# Patient Record
Sex: Female | Born: 1980 | Hispanic: Yes | Marital: Married | Smoking: Never smoker
Health system: Southern US, Community
[De-identification: ages and names within clinical notes are randomized; demographics above are authoritative.]

## PROBLEM LIST (undated history)

## (undated) DIAGNOSIS — R51 Headache: Secondary | ICD-10-CM

## (undated) DIAGNOSIS — K219 Gastro-esophageal reflux disease without esophagitis: Secondary | ICD-10-CM

## (undated) DIAGNOSIS — R42 Dizziness and giddiness: Secondary | ICD-10-CM

## (undated) DIAGNOSIS — R519 Headache, unspecified: Secondary | ICD-10-CM

## (undated) DIAGNOSIS — F419 Anxiety disorder, unspecified: Secondary | ICD-10-CM

---

## 2009-12-15 ENCOUNTER — Ambulatory Visit: Payer: Self-pay | Admitting: Otolaryngology

## 2017-03-18 ENCOUNTER — Encounter
Admission: RE | Admit: 2017-03-18 | Discharge: 2017-03-18 | Disposition: A | Discharge status: Home / Self Care | Payer: BC Managed Care – PPO | Source: Ambulatory Visit | Attending: Unknown Physician Specialty | Admitting: Unknown Physician Specialty

## 2017-03-18 ENCOUNTER — Encounter: Payer: Self-pay | Admitting: *Deleted

## 2017-03-18 HISTORY — DX: Headache, unspecified: R51.9

## 2017-03-18 HISTORY — DX: Anxiety disorder, unspecified: F41.9

## 2017-03-18 HISTORY — DX: Gastro-esophageal reflux disease without esophagitis: K21.9

## 2017-03-18 HISTORY — DX: Headache: R51

## 2017-03-18 NOTE — Patient Instructions (Addendum)
  Your procedure is scheduled on: 03-25-17 Report to Same Day Surgery 2nd floor medical mall [facility](Medical Mall Entrance-take elevator on left to 2nd floor.  Check in with surgery information desk.) To find out your arrival time please call [phone] between 1PM - 3PM on 03-22-17  Remember: Instructions that are not followed completely may result in serious medical risk, up to and including death, or upon the discretion of your surgeon and anesthesiologist your surgery may need to be rescheduled.    _x___ 1. Do not eat food or drink liquids after midnight. No gum chewing or hard candies.     __x__ 2. No Alcohol for 24 hours before or after surgery.   __x__3. No Smoking for 24 prior to surgery.   ____  4. Bring all medications with you on the day of surgery if instructed.    __x__ 5. Notify your doctor if there is any change in your medical condition     (cold, fever, infections).     Do not wear jewelry, make-up, hairpins, clips or nail polish.  Do not wear lotions, powders, or perfumes. You may wear deodorant.  Do not shave 48 hours prior to surgery. Men may shave face and neck.  Do not bring valuables to the hospital.    [facility] is not responsible for any belongings or valuables.               Contacts, dentures or bridgework may not be worn into surgery.  Leave your suitcase in the car. After surgery it may be brought to your room.  For patients admitted to the hospital, discharge time is determined by your                       treatment team.   Patients discharged the day of surgery will not be allowed to drive home.  You will need someone to drive you home and stay with you the night of your procedure.    Please read over the following fact sheets that you were given:     ____ Take anti-hypertensive (unless it includes a diuretic), cardiac, seizure, asthma,     anti-reflux and psychiatric medicines. These include:  1. NONE  2.  3.  4.  5.  6.  ____Fleets enema or  Magnesium Citrate as directed.   ____ Use CHG Soap or sage wipes as directed on instruction sheet   ____ Use inhalers on the day of surgery and bring to hospital day of surgery  ____ Stop Metformin and Janumet 2 days prior to surgery.    ____ Take 1/2 of usual insulin dose the night before surgery and none on the morning     surgery.   ____ Follow recommendations from Cardiologist, Pulmonologist or PCP regarding stopping Aspirin, Coumadin, Pllavix ,Eliquis, Effient, or Pradaxa, and Pletal.  X____Stop Anti-inflammatories such as Advil, Aleve, Ibuprofen, Motrin, Naproxen, Naprosyn, Goodies powders, EXCEDRIN MIGRANE or aspirin products NOW-OK to take Tylenol   ____ Stop supplements until after surgery.     ____ Bring C-Pap to the hospital.

## 2017-03-22 MED ORDER — MIDAZOLAM HCL 2 MG/ML PO SYRP: 0.3000 mg/kg | ORAL_SOLUTION | Freq: Once | ORAL | Status: DC

## 2017-03-24 MED ORDER — FAMOTIDINE 20 MG PO TABS
20.0000 mg | ORAL_TABLET | Freq: Once | ORAL | Status: AC
  Administered 2017-03-25: 20 mg via ORAL

## 2017-03-25 ENCOUNTER — Encounter
Admission: RE | Disposition: A | Discharge status: Home / Self Care | Payer: Self-pay | Source: Ambulatory Visit | Attending: Unknown Physician Specialty

## 2017-03-25 ENCOUNTER — Ambulatory Visit
Admission: RE | Admit: 2017-03-25 | Discharge: 2017-03-25 | Disposition: A | Discharge status: Home / Self Care | Payer: BC Managed Care – PPO | Source: Ambulatory Visit | Attending: Unknown Physician Specialty | Admitting: Unknown Physician Specialty

## 2017-03-25 ENCOUNTER — Ambulatory Visit: Payer: BC Managed Care – PPO | Admitting: Anesthesiology

## 2017-03-25 ENCOUNTER — Encounter: Payer: Self-pay | Admitting: *Deleted

## 2017-03-25 DIAGNOSIS — Z833 Family history of diabetes mellitus: Secondary | ICD-10-CM | POA: Insufficient documentation

## 2017-03-25 DIAGNOSIS — Z91013 Allergy to seafood: Secondary | ICD-10-CM | POA: Diagnosis not present

## 2017-03-25 DIAGNOSIS — Z8349 Family history of other endocrine, nutritional and metabolic diseases: Secondary | ICD-10-CM | POA: Diagnosis not present

## 2017-03-25 DIAGNOSIS — Z91040 Latex allergy status: Secondary | ICD-10-CM | POA: Insufficient documentation

## 2017-03-25 DIAGNOSIS — Z79899 Other long term (current) drug therapy: Secondary | ICD-10-CM | POA: Diagnosis not present

## 2017-03-25 DIAGNOSIS — K219 Gastro-esophageal reflux disease without esophagitis: Secondary | ICD-10-CM | POA: Diagnosis not present

## 2017-03-25 DIAGNOSIS — F329 Major depressive disorder, single episode, unspecified: Secondary | ICD-10-CM | POA: Diagnosis not present

## 2017-03-25 DIAGNOSIS — R2232 Localized swelling, mass and lump, left upper limb: Secondary | ICD-10-CM | POA: Diagnosis present

## 2017-03-25 DIAGNOSIS — Z888 Allergy status to other drugs, medicaments and biological substances status: Secondary | ICD-10-CM | POA: Diagnosis not present

## 2017-03-25 DIAGNOSIS — G43909 Migraine, unspecified, not intractable, without status migrainosus: Secondary | ICD-10-CM | POA: Insufficient documentation

## 2017-03-25 DIAGNOSIS — M67442 Ganglion, left hand: Secondary | ICD-10-CM | POA: Insufficient documentation

## 2017-03-25 DIAGNOSIS — F419 Anxiety disorder, unspecified: Secondary | ICD-10-CM | POA: Diagnosis not present

## 2017-03-25 DIAGNOSIS — J309 Allergic rhinitis, unspecified: Secondary | ICD-10-CM | POA: Diagnosis not present

## 2017-03-25 HISTORY — PX: GANGLION CYST EXCISION: SHX1691

## 2017-03-25 LAB — POCT PREGNANCY, URINE: Preg Test, Ur: NEGATIVE

## 2017-03-25 SURGERY — EXCISION, GANGLION CYST, WRIST
Anesthesia: General | Laterality: Left

## 2017-03-25 MED ORDER — FENTANYL CITRATE (PF) 100 MCG/2ML IJ SOLN
INTRAMUSCULAR | Status: AC
  Filled 2017-03-25: qty 2

## 2017-03-25 MED ORDER — ONDANSETRON HCL 4 MG/2ML IJ SOLN
4.0000 mg | Freq: Once | INTRAMUSCULAR | Status: AC | PRN
  Administered 2017-03-25: 4 mg via INTRAVENOUS

## 2017-03-25 MED ORDER — FENTANYL CITRATE (PF) 100 MCG/2ML IJ SOLN
INTRAMUSCULAR | Status: AC
  Administered 2017-03-25: 25 ug via INTRAVENOUS
  Filled 2017-03-25: qty 2

## 2017-03-25 MED ORDER — NORCO 5-325 MG PO TABS: 1.0000 | ORAL_TABLET | Freq: Four times a day (QID) | ORAL | 0 refills | Status: DC | PRN

## 2017-03-25 MED ORDER — FAMOTIDINE 20 MG PO TABS
ORAL_TABLET | ORAL | Status: AC
  Administered 2017-03-25: 20 mg via ORAL
  Filled 2017-03-25: qty 1

## 2017-03-25 MED ORDER — KETOROLAC TROMETHAMINE 30 MG/ML IJ SOLN
INTRAMUSCULAR | Status: DC | PRN
  Administered 2017-03-25: 30 mg via INTRAVENOUS

## 2017-03-25 MED ORDER — FENTANYL CITRATE (PF) 100 MCG/2ML IJ SOLN
INTRAMUSCULAR | Status: DC | PRN
  Administered 2017-03-25 (×4): 25 ug via INTRAVENOUS

## 2017-03-25 MED ORDER — LACTATED RINGERS IV SOLN
INTRAVENOUS | Status: DC
  Administered 2017-03-25: 07:00:00 via INTRAVENOUS

## 2017-03-25 MED ORDER — ONDANSETRON HCL 4 MG/2ML IJ SOLN
INTRAMUSCULAR | Status: AC
  Filled 2017-03-25: qty 2

## 2017-03-25 MED ORDER — BUPIVACAINE HCL (PF) 0.5 % IJ SOLN
INTRAMUSCULAR | Status: DC | PRN
  Administered 2017-03-25: 6 mL

## 2017-03-25 MED ORDER — ONDANSETRON HCL 4 MG/2ML IJ SOLN
INTRAMUSCULAR | Status: DC | PRN
Start: 2017-03-25 — End: 2017-03-25
  Administered 2017-03-25: 4 mg via INTRAVENOUS

## 2017-03-25 MED ORDER — FENTANYL CITRATE (PF) 100 MCG/2ML IJ SOLN
25.0000 ug | INTRAMUSCULAR | Status: DC | PRN
  Administered 2017-03-25 (×4): 25 ug via INTRAVENOUS

## 2017-03-25 MED ORDER — BUPIVACAINE HCL (PF) 0.5 % IJ SOLN
INTRAMUSCULAR | Status: AC
  Filled 2017-03-25: qty 30

## 2017-03-25 MED ORDER — PROPOFOL 10 MG/ML IV BOLUS
INTRAVENOUS | Status: DC | PRN
  Administered 2017-03-25: 140 mg via INTRAVENOUS

## 2017-03-25 MED ORDER — LIDOCAINE HCL (CARDIAC) 20 MG/ML IV SOLN
INTRAVENOUS | Status: DC | PRN
  Administered 2017-03-25: 50 mg via INTRAVENOUS

## 2017-03-25 MED ORDER — MIDAZOLAM HCL 2 MG/2ML IJ SOLN
INTRAMUSCULAR | Status: DC | PRN
  Administered 2017-03-25: 2 mg via INTRAVENOUS

## 2017-03-25 MED ORDER — PROPOFOL 500 MG/50ML IV EMUL
INTRAVENOUS | Status: AC
  Filled 2017-03-25: qty 50

## 2017-03-25 MED ORDER — LACTATED RINGERS IV SOLN
INTRAVENOUS | Status: DC | PRN
  Administered 2017-03-25: 07:00:00 via INTRAVENOUS

## 2017-03-25 MED ORDER — MIDAZOLAM HCL 2 MG/2ML IJ SOLN
INTRAMUSCULAR | Status: AC
  Filled 2017-03-25: qty 2

## 2017-03-25 SURGICAL SUPPLY — 25 items
BANDAGE ELASTIC 3 LF NS (GAUZE/BANDAGES/DRESSINGS) ×3 IMPLANT
BNDG ESMARK 4X12 TAN STRL LF (GAUZE/BANDAGES/DRESSINGS) ×3 IMPLANT
CANISTER SUCT 1200ML W/VALVE (MISCELLANEOUS) ×3 IMPLANT
CAST PADDING 3X4FT ST 30246 (SOFTGOODS) ×2
CHLORAPREP W/TINT 26ML (MISCELLANEOUS) ×3 IMPLANT
CUFF TOURN 18 STER (MISCELLANEOUS) ×3 IMPLANT
DURAPREP 26ML APPLICATOR (WOUND CARE) ×3 IMPLANT
ELECT REM PT RETURN 9FT ADLT (ELECTROSURGICAL) ×3
ELECTRODE REM PT RTRN 9FT ADLT (ELECTROSURGICAL) ×1 IMPLANT
GAUZE SPONGE 4X4 12PLY STRL (GAUZE/BANDAGES/DRESSINGS) ×3 IMPLANT
GLOVE BIO SURGEON STRL SZ8 (GLOVE) ×3 IMPLANT
GLOVE BIOGEL M STRL SZ7.5 (GLOVE) ×3 IMPLANT
GLOVE INDICATOR 8.0 STRL GRN (GLOVE) ×3 IMPLANT
GOWN STRL REUS W/ TWL LRG LVL3 (GOWN DISPOSABLE) ×1 IMPLANT
GOWN STRL REUS W/TWL LRG LVL3 (GOWN DISPOSABLE) ×2
GOWN STRL REUS W/TWL LRG LVL4 (GOWN DISPOSABLE) ×3 IMPLANT
KIT RM TURNOVER STRD PROC AR (KITS) ×3 IMPLANT
NS IRRIG 500ML POUR BTL (IV SOLUTION) ×3 IMPLANT
PACK EXTREMITY ARMC (MISCELLANEOUS) ×3 IMPLANT
PAD CAST CTTN 3X4 STRL (SOFTGOODS) ×1 IMPLANT
SPLINT CAST 1 STEP 3X12 (MISCELLANEOUS) ×3 IMPLANT
STOCKINETTE STRL 4IN 9604848 (GAUZE/BANDAGES/DRESSINGS) ×3 IMPLANT
SUT ETHILON 4-0 (SUTURE) ×2
SUT ETHILON 4-0 FS2 18XMFL BLK (SUTURE) ×1
SUTURE ETHLN 4-0 FS2 18XMF BLK (SUTURE) ×1 IMPLANT

## 2017-03-25 NOTE — Anesthesia Postprocedure Evaluation (Signed)
Anesthesia Post Note  Patient: Jamie Sheppard  Procedure(s) Performed: Procedure(s) (LRB): REMOVAL GANGLION OF WRIST (Left)  Patient location during evaluation: PACU Anesthesia Type: General Level of consciousness: awake and alert Pain management: pain level controlled Vital Signs Assessment: post-procedure vital signs reviewed and stable Respiratory status: spontaneous breathing, nonlabored ventilation, respiratory function stable and patient connected to nasal cannula oxygen Cardiovascular status: blood pressure returned to baseline and stable Postop Assessment: no signs of nausea or vomiting Anesthetic complications: no     Last Vitals:  Vitals:   03/25/17 0931 03/25/17 1003  BP: (!) 107/54 (!) 144/59  Pulse: 81 77  Resp: 18 18  Temp: (!) 35.9 C (!) 35.8 C    Last Pain:  Vitals:   03/25/17 1003  TempSrc: Temporal  PainSc: 3                  Odes Lolli S

## 2017-03-25 NOTE — Discharge Instructions (Signed)
Elevation  Ice pack  RTC in about 10 days   AMBULATORY SURGERY  DISCHARGE INSTRUCTIONS   1) The drugs that you were given will stay in your system until tomorrow so for the next 24 hours you should not:  A) Drive an automobile B) Make any legal decisions C) Drink any alcoholic beverage   2) You may resume regular meals tomorrow.  Today it is better to start with liquids and gradually work up to solid foods.  You may eat anything you prefer, but it is better to start with liquids, then soup and crackers, and gradually work up to solid foods.   3) Please notify your doctor immediately if you have any unusual bleeding, trouble breathing, redness and pain at the surgery site, drainage, fever, or pain not relieved by medication.    4) Additional Instructions:        Please contact your physician with any problems or Same Day Surgery at [phone], Monday through Friday 6 am to 4 pm, or [place] at [facility] number at [phone].

## 2017-03-25 NOTE — Op Note (Signed)
03/25/2017  8:48 AM  PATIENT:  Jamie Sheppard  36 y.o. female  PRE-OPERATIVE DIAGNOSIS:  Palmar soft tissue mass of left hand  POST-OPERATIVE DIAGNOSIS: Palmar soft tissue mass of left hand  PROCEDURE:  Procedure(s): REMOVAL GANGLION OF WRIST (Left) Excision palmar mass left hand  SURGEON:   Isidoro Donning., MD  ANESTHESIA: Gen.  IMPLANTS: None  HISTORY: Patient had a long history of a painful mass in the palm of her left hand.  OP NOTE: The patient was taken to the operating room where satisfactory general anesthesia was achieved. A tourniquet was applied to the patient's left upper arm. The left upper extremity was then prepped and draped in usual fashion for procedure about the hand. The left upper extremity was then exsanguinated and the tourniquet was inflated. About a inch and a half incision was made over the midportion of the mall palmar mass. The mass was located just adjacent to the thenar eminence in the mid palmar area. I bluntly and sharply dissected the mass from the surrounding soft tissue. The mass seemed to have a ganglion-type appearance and had a bluish coloration. I was able to dissect it away from the thenar musculature. There did not seem to be any nerve or vascular branch penetrating the mass. The mass was  removed in its entirety. It was sent to pathology for  evaluation. The tourniquet was released at this time. It was up for about 13 minutes. Bleeding was controlled with coagulation cautery. The wound was irrigated with saline. I then closed the skin with 4-0 nylon sutures in vertical mattress fashion. A soft compression dressing was applied.  The patient was then awakened and transferred to her hospital bed. She was taken to the recovery room in satisfactory condition. Blood loss was negligible.

## 2017-03-25 NOTE — Anesthesia Preprocedure Evaluation (Signed)
Anesthesia Evaluation  Patient identified by MRN, date of birth, ID band Patient awake    Reviewed: Allergy & Precautions, NPO status , Patient's Chart, lab work & pertinent test results, reviewed documented beta blocker date and time   Airway Mallampati: II  TM Distance: >3 FB     Dental  (+) Chipped   Pulmonary           Cardiovascular      Neuro/Psych  Headaches, Anxiety    GI/Hepatic GERD  Controlled,  Endo/Other    Renal/GU      Musculoskeletal   Abdominal   Peds  Hematology   Anesthesia Other Findings   Reproductive/Obstetrics                             Anesthesia Physical Anesthesia Plan  ASA: II  Anesthesia Plan: General   Post-op Pain Management:    Induction: Intravenous  Airway Management Planned: LMA  Additional Equipment:   Intra-op Plan:   Post-operative Plan:   Informed Consent: I have reviewed the patients History and Physical, chart, labs and discussed the procedure including the risks, benefits and alternatives for the proposed anesthesia with the patient or authorized representative who has indicated his/her understanding and acceptance.     Plan Discussed with: CRNA  Anesthesia Plan Comments:         Anesthesia Quick Evaluation

## 2017-03-25 NOTE — Anesthesia Procedure Notes (Signed)
Procedure Name: LMA Insertion Date/Time: 03/25/2017 7:32 AM Performed by: ZOXWRUE, Ebonye Reade Pre-anesthesia Checklist: Patient identified, Emergency Drugs available, Suction available, Patient being monitored and Timeout performed Patient Re-evaluated:Patient Re-evaluated prior to inductionPreoxygenation: Pre-oxygenation with 100% oxygen Intubation Type: IV induction LMA: LMA inserted LMA Size: 4.0 Number of attempts: 1 Placement Confirmation: CO2 detector,  positive ETCO2 and breath sounds checked- equal and bilateral Tube secured with: Tape

## 2017-03-25 NOTE — H&P (Signed)
  H and P reviewed. No changes. Uploaded at later date. 

## 2017-03-25 NOTE — Transfer of Care (Signed)
Immediate Anesthesia Transfer of Care Note  Patient: Jamie Sheppard  Procedure(s) Performed: Procedure(s): REMOVAL GANGLION OF WRIST (Left)  Patient Location: PACU  Anesthesia Type:General  Level of Consciousness: awake and alert   Airway & Oxygen Therapy: Patient connected to face mask oxygen  Post-op Assessment: Report given to RN  Post vital signs: Reviewed and stable  Last Vitals:  Vitals:   03/25/17 0613  BP: 113/68  Pulse: 87  Resp: 16  Temp: 36.3 C    Last Pain:  Vitals:   03/25/17 0616  TempSrc:   PainSc: 2          Complications: No apparent anesthesia complications

## 2017-03-25 NOTE — Anesthesia Post-op Follow-up Note (Cosign Needed)
Anesthesia QCDR form completed.        

## 2017-03-26 ENCOUNTER — Encounter: Payer: Self-pay | Admitting: Unknown Physician Specialty

## 2017-03-26 LAB — SURGICAL PATHOLOGY

## 2017-07-11 ENCOUNTER — Other Ambulatory Visit: Payer: Self-pay | Admitting: Family Medicine

## 2017-07-11 DIAGNOSIS — R1011 Right upper quadrant pain: Secondary | ICD-10-CM

## 2017-07-12 ENCOUNTER — Other Ambulatory Visit: Payer: Self-pay | Admitting: Obstetrics and Gynecology

## 2017-07-12 DIAGNOSIS — Z1231 Encounter for screening mammogram for malignant neoplasm of breast: Secondary | ICD-10-CM

## 2017-07-15 ENCOUNTER — Ambulatory Visit
Admission: RE | Admit: 2017-07-15 | Discharge: 2017-07-15 | Disposition: A | Discharge status: Home / Self Care | Payer: BC Managed Care – PPO | Source: Ambulatory Visit | Attending: Family Medicine | Admitting: Family Medicine

## 2017-07-15 ENCOUNTER — Other Ambulatory Visit: Payer: Self-pay | Admitting: Family Medicine

## 2017-07-15 DIAGNOSIS — R160 Hepatomegaly, not elsewhere classified: Secondary | ICD-10-CM | POA: Insufficient documentation

## 2017-07-15 DIAGNOSIS — N1339 Other hydronephrosis: Secondary | ICD-10-CM

## 2017-07-15 DIAGNOSIS — R1011 Right upper quadrant pain: Secondary | ICD-10-CM | POA: Insufficient documentation

## 2017-07-15 DIAGNOSIS — N839 Noninflammatory disorder of ovary, fallopian tube and broad ligament, unspecified: Secondary | ICD-10-CM | POA: Insufficient documentation

## 2017-07-15 DIAGNOSIS — N1330 Unspecified hydronephrosis: Secondary | ICD-10-CM | POA: Insufficient documentation

## 2017-07-23 ENCOUNTER — Other Ambulatory Visit: Payer: Self-pay | Admitting: Student

## 2017-07-23 DIAGNOSIS — R1011 Right upper quadrant pain: Secondary | ICD-10-CM

## 2017-07-23 DIAGNOSIS — R110 Nausea: Secondary | ICD-10-CM

## 2017-07-26 ENCOUNTER — Ambulatory Visit
Admission: RE | Admit: 2017-07-26 | Discharge: 2017-07-26 | Disposition: A | Discharge status: Home / Self Care | Payer: BC Managed Care – PPO | Source: Ambulatory Visit | Attending: Obstetrics and Gynecology | Admitting: Obstetrics and Gynecology

## 2017-07-26 DIAGNOSIS — R928 Other abnormal and inconclusive findings on diagnostic imaging of breast: Secondary | ICD-10-CM | POA: Diagnosis not present

## 2017-07-26 DIAGNOSIS — Z1231 Encounter for screening mammogram for malignant neoplasm of breast: Secondary | ICD-10-CM | POA: Insufficient documentation

## 2017-08-02 ENCOUNTER — Other Ambulatory Visit: Payer: Self-pay | Admitting: Obstetrics and Gynecology

## 2017-08-02 DIAGNOSIS — R928 Other abnormal and inconclusive findings on diagnostic imaging of breast: Secondary | ICD-10-CM

## 2017-08-12 ENCOUNTER — Ambulatory Visit
Admission: RE | Admit: 2017-08-12 | Discharge: 2017-08-12 | Disposition: A | Discharge status: Home / Self Care | Payer: BC Managed Care – PPO | Source: Ambulatory Visit | Attending: Student | Admitting: Student

## 2017-08-12 ENCOUNTER — Ambulatory Visit
Admission: RE | Admit: 2017-08-12 | Discharge: 2017-08-12 | Disposition: A | Discharge status: Home / Self Care | Payer: BC Managed Care – PPO | Source: Ambulatory Visit | Attending: Obstetrics and Gynecology | Admitting: Obstetrics and Gynecology

## 2017-08-12 ENCOUNTER — Other Ambulatory Visit
Admission: RE | Admit: 2017-08-12 | Discharge: 2017-08-12 | Disposition: A | Discharge status: Home / Self Care | Payer: BC Managed Care – PPO | Source: Ambulatory Visit | Attending: Student | Admitting: Student

## 2017-08-12 DIAGNOSIS — R110 Nausea: Secondary | ICD-10-CM | POA: Diagnosis present

## 2017-08-12 DIAGNOSIS — R928 Other abnormal and inconclusive findings on diagnostic imaging of breast: Secondary | ICD-10-CM

## 2017-08-12 DIAGNOSIS — N6002 Solitary cyst of left breast: Secondary | ICD-10-CM | POA: Insufficient documentation

## 2017-08-12 DIAGNOSIS — R1011 Right upper quadrant pain: Secondary | ICD-10-CM | POA: Insufficient documentation

## 2017-08-12 DIAGNOSIS — N6321 Unspecified lump in the left breast, upper outer quadrant: Secondary | ICD-10-CM | POA: Diagnosis present

## 2017-08-12 LAB — PREGNANCY, URINE: PREG TEST UR: NEGATIVE

## 2017-08-12 MED ORDER — TECHNETIUM TC 99M MEBROFENIN IV KIT
5.0700 | PACK | Freq: Once | INTRAVENOUS | Status: AC | PRN
  Administered 2017-08-12: 5.07 via INTRAVENOUS

## 2017-09-17 ENCOUNTER — Encounter
Admission: RE | Admit: 2017-09-17 | Discharge: 2017-09-17 | Disposition: A | Discharge status: Home / Self Care | Payer: BC Managed Care – PPO | Source: Ambulatory Visit | Attending: Surgery | Admitting: Surgery

## 2017-09-17 NOTE — Patient Instructions (Signed)
  Your procedure is scheduled on: 09-24-17 Report to Same Day Surgery 2nd floor medical mall [facility] Entrance-take elevator on left to 2nd floor.  Check in with surgery information desk.) To find out your arrival time please call [phone] between 1PM - 3PM on 09-23-17  Remember: Instructions that are not followed completely may result in serious medical risk, up to and including death, or upon the discretion of your surgeon and anesthesiologist your surgery may need to be rescheduled.    _x___ 1. Do not eat food after midnight the night before your procedure. You may drink clear liquids up to 2 hours before you are scheduled to arrive at the hospital for your procedure.  Do not drink clear liquids within 2 hours of your scheduled arrival to the hospital.  Clear liquids include  --Water or Apple juice without pulp  --Clear carbohydrate beverage such as ClearFast or Gatorade  --Black Coffee or Clear Tea (No milk, no creamers, do not add anything to the coffee or Tea Type 1 and type 2 diabetics should only drink water.  No gum chewing or hard candies.     __x__ 2. No Alcohol for 24 hours before or after surgery.   __x__3. No Smoking for 24 prior to surgery.   ____  4. Bring all medications with you on the day of surgery if instructed.    __x__ 5. Notify your doctor if there is any change in your medical condition     (cold, fever, infections).     Do not wear jewelry, make-up, hairpins, clips or nail polish.  Do not wear lotions, powders, or perfumes. You may wear deodorant.  Do not shave 48 hours prior to surgery. Men may shave face and neck.  Do not bring valuables to the hospital.    [facility] is not responsible for any belongings or valuables.               Contacts, dentures or bridgework may not be worn into surgery.  Leave your suitcase in the car. After surgery it may be brought to your room.  For patients admitted to the hospital, discharge time is determined by your  treatment team.   Patients discharged the day of surgery will not be allowed to drive home.  You will need someone to drive you home and stay with you the night of your procedure.    Please read over the following fact sheets that you were given:   [facility] Preparing for Surgery and or MRSA Information   _x___ Take anti-hypertensive listed below, cardiac, seizure, asthma,anti-reflux and psychiatric medicines. These include:  1. PRILOSEC  2.  3.  4.  5.  6.  ____Fleets enema or Magnesium Citrate as directed.   ____ Use CHG Soap or sage wipes as directed on instruction sheet   ____ Use inhalers on the day of surgery and bring to hospital day of surgery  ____ Stop Metformin and Janumet 2 days prior to surgery.    ____ Take 1/2 of usual insulin dose the night before surgery and none on the morning surgery.   ____ Follow recommendations from Cardiologist, Pulmonologist or PCP regarding stopping Aspirin, Coumadin, Plavix ,Eliquis, Effient, or Pradaxa, and Pletal.  X____Stop Anti-inflammatories such as Advil, Aleve, Ibuprofen, Motrin, Naproxen, MELOXICAM,  Naprosyn, Goodies powders or aspirin products NOW-OK to take Tylenol    ____ Stop supplements until after surgery.     ____ Bring C-Pap to the hospital.

## 2017-09-24 ENCOUNTER — Ambulatory Visit: Payer: BC Managed Care – PPO | Admitting: Anesthesiology

## 2017-09-24 ENCOUNTER — Encounter: Payer: Self-pay | Admitting: *Deleted

## 2017-09-24 ENCOUNTER — Encounter
Admission: RE | Disposition: A | Discharge status: Home / Self Care | Payer: Self-pay | Source: Ambulatory Visit | Attending: Surgery

## 2017-09-24 ENCOUNTER — Ambulatory Visit
Admission: RE | Admit: 2017-09-24 | Discharge: 2017-09-24 | Disposition: A | Discharge status: Home / Self Care | Payer: BC Managed Care – PPO | Source: Ambulatory Visit | Attending: Surgery | Admitting: Surgery

## 2017-09-24 ENCOUNTER — Ambulatory Visit: Payer: BC Managed Care – PPO

## 2017-09-24 DIAGNOSIS — Z79899 Other long term (current) drug therapy: Secondary | ICD-10-CM | POA: Insufficient documentation

## 2017-09-24 DIAGNOSIS — K8010 Calculus of gallbladder with chronic cholecystitis without obstruction: Secondary | ICD-10-CM | POA: Insufficient documentation

## 2017-09-24 DIAGNOSIS — K811 Chronic cholecystitis: Secondary | ICD-10-CM | POA: Diagnosis present

## 2017-09-24 DIAGNOSIS — K819 Cholecystitis, unspecified: Secondary | ICD-10-CM

## 2017-09-24 HISTORY — PX: CHOLECYSTECTOMY: SHX55

## 2017-09-24 HISTORY — DX: Dizziness and giddiness: R42

## 2017-09-24 LAB — POCT PREGNANCY, URINE: PREG TEST UR: NEGATIVE

## 2017-09-24 SURGERY — LAPAROSCOPIC CHOLECYSTECTOMY WITH INTRAOPERATIVE CHOLANGIOGRAM
Anesthesia: General

## 2017-09-24 MED ORDER — ACETAMINOPHEN 10 MG/ML IV SOLN
INTRAVENOUS | Status: AC
  Filled 2017-09-24: qty 100

## 2017-09-24 MED ORDER — SUCCINYLCHOLINE CHLORIDE 20 MG/ML IJ SOLN
INTRAMUSCULAR | Status: AC
  Filled 2017-09-24: qty 1

## 2017-09-24 MED ORDER — PROPOFOL 10 MG/ML IV BOLUS
INTRAVENOUS | Status: DC | PRN
  Administered 2017-09-24: 120 mg via INTRAVENOUS

## 2017-09-24 MED ORDER — BUPIVACAINE-EPINEPHRINE (PF) 0.5% -1:200000 IJ SOLN
INTRAMUSCULAR | Status: AC
  Filled 2017-09-24: qty 30

## 2017-09-24 MED ORDER — ACETAMINOPHEN 10 MG/ML IV SOLN
INTRAVENOUS | Status: DC | PRN
  Administered 2017-09-24: 1000 mg via INTRAVENOUS

## 2017-09-24 MED ORDER — OXYCODONE HCL 5 MG/5ML PO SOLN: 5.0000 mg | Freq: Once | ORAL | Status: DC | PRN

## 2017-09-24 MED ORDER — TRAMADOL HCL 50 MG PO TABS: 50.0000 mg | ORAL_TABLET | Freq: Four times a day (QID) | ORAL | 0 refills | Status: DC | PRN

## 2017-09-24 MED ORDER — FENTANYL CITRATE (PF) 100 MCG/2ML IJ SOLN
INTRAMUSCULAR | Status: AC
  Filled 2017-09-24: qty 2

## 2017-09-24 MED ORDER — ROCURONIUM BROMIDE 50 MG/5ML IV SOLN
INTRAVENOUS | Status: AC
  Filled 2017-09-24: qty 1

## 2017-09-24 MED ORDER — FENTANYL CITRATE (PF) 100 MCG/2ML IJ SOLN
INTRAMUSCULAR | Status: AC
  Administered 2017-09-24: 25 ug via INTRAVENOUS
  Filled 2017-09-24: qty 2

## 2017-09-24 MED ORDER — BUPIVACAINE-EPINEPHRINE 0.5% -1:200000 IJ SOLN
INTRAMUSCULAR | Status: DC | PRN
  Administered 2017-09-24: 10 mL

## 2017-09-24 MED ORDER — TRAMADOL HCL 50 MG PO TABS: 50.0000 mg | ORAL_TABLET | Freq: Four times a day (QID) | ORAL | 0 refills | Status: AC | PRN

## 2017-09-24 MED ORDER — SODIUM CHLORIDE 0.9 % IJ SOLN
INTRAMUSCULAR | Status: DC
Start: 2017-09-24 — End: 2017-09-24
  Filled 2017-09-24: qty 10

## 2017-09-24 MED ORDER — DEXAMETHASONE SODIUM PHOSPHATE 10 MG/ML IJ SOLN
INTRAMUSCULAR | Status: DC | PRN
  Administered 2017-09-24: 10 mg via INTRAVENOUS

## 2017-09-24 MED ORDER — MIDAZOLAM HCL 2 MG/2ML IJ SOLN
INTRAMUSCULAR | Status: AC
  Filled 2017-09-24: qty 2

## 2017-09-24 MED ORDER — FENTANYL CITRATE (PF) 100 MCG/2ML IJ SOLN
25.0000 ug | INTRAMUSCULAR | Status: DC | PRN
  Administered 2017-09-24: 25 ug via INTRAVENOUS

## 2017-09-24 MED ORDER — LACTATED RINGERS IV SOLN
INTRAVENOUS | Status: DC
  Administered 2017-09-24: 50 mL/h via INTRAVENOUS

## 2017-09-24 MED ORDER — ONDANSETRON HCL 4 MG/2ML IJ SOLN
INTRAMUSCULAR | Status: AC
  Filled 2017-09-24: qty 2

## 2017-09-24 MED ORDER — EPHEDRINE SULFATE 50 MG/ML IJ SOLN
INTRAMUSCULAR | Status: AC
  Filled 2017-09-24: qty 1

## 2017-09-24 MED ORDER — DEXAMETHASONE SODIUM PHOSPHATE 10 MG/ML IJ SOLN
INTRAMUSCULAR | Status: AC
  Filled 2017-09-24: qty 1

## 2017-09-24 MED ORDER — PROPOFOL 10 MG/ML IV BOLUS
INTRAVENOUS | Status: AC
  Filled 2017-09-24: qty 40

## 2017-09-24 MED ORDER — LIDOCAINE HCL (CARDIAC) 20 MG/ML IV SOLN
INTRAVENOUS | Status: DC | PRN
  Administered 2017-09-24: 80 mg via INTRAVENOUS

## 2017-09-24 MED ORDER — PHENYLEPHRINE HCL 10 MG/ML IJ SOLN
INTRAMUSCULAR | Status: AC
  Filled 2017-09-24: qty 1

## 2017-09-24 MED ORDER — PROMETHAZINE HCL 25 MG/ML IJ SOLN
6.2500 mg | INTRAMUSCULAR | Status: DC | PRN
  Administered 2017-09-24: 6.25 mg via INTRAVENOUS

## 2017-09-24 MED ORDER — OXYCODONE HCL 5 MG PO TABS: 5.0000 mg | ORAL_TABLET | Freq: Once | ORAL | Status: DC | PRN

## 2017-09-24 MED ORDER — FENTANYL CITRATE (PF) 100 MCG/2ML IJ SOLN
INTRAMUSCULAR | Status: DC | PRN
  Administered 2017-09-24: 100 ug via INTRAVENOUS
  Administered 2017-09-24: 50 ug via INTRAVENOUS

## 2017-09-24 MED ORDER — LIDOCAINE HCL (PF) 2 % IJ SOLN
INTRAMUSCULAR | Status: AC
  Filled 2017-09-24: qty 6

## 2017-09-24 MED ORDER — MIDAZOLAM HCL 2 MG/2ML IJ SOLN
INTRAMUSCULAR | Status: DC | PRN
  Administered 2017-09-24: 2 mg via INTRAVENOUS

## 2017-09-24 MED ORDER — SUGAMMADEX SODIUM 200 MG/2ML IV SOLN
INTRAVENOUS | Status: AC
  Filled 2017-09-24: qty 2

## 2017-09-24 MED ORDER — SCOPOLAMINE 1 MG/3DAYS TD PT72
MEDICATED_PATCH | TRANSDERMAL | Status: AC
  Filled 2017-09-24: qty 1

## 2017-09-24 MED ORDER — HEPARIN SODIUM (PORCINE) 5000 UNIT/ML IJ SOLN
INTRAMUSCULAR | Status: AC
  Filled 2017-09-24: qty 1

## 2017-09-24 MED ORDER — LIDOCAINE HCL 2 % EX GEL
CUTANEOUS | Status: DC | PRN
  Administered 2017-09-24: 1 via TOPICAL

## 2017-09-24 MED ORDER — GLYCOPYRROLATE 0.2 MG/ML IJ SOLN
INTRAMUSCULAR | Status: AC
  Filled 2017-09-24: qty 1

## 2017-09-24 MED ORDER — ONDANSETRON HCL 4 MG/2ML IJ SOLN
INTRAMUSCULAR | Status: DC | PRN
  Administered 2017-09-24: 4 mg via INTRAVENOUS

## 2017-09-24 MED ORDER — PROMETHAZINE HCL 25 MG/ML IJ SOLN
INTRAMUSCULAR | Status: AC
  Administered 2017-09-24: 6.25 mg via INTRAVENOUS
  Filled 2017-09-24: qty 1

## 2017-09-24 MED ORDER — TRAMADOL HCL 50 MG PO TABS: 50.0000 mg | ORAL_TABLET | ORAL | Status: DC | PRN

## 2017-09-24 MED ORDER — LIDOCAINE HCL 2 % EX GEL
CUTANEOUS | Status: AC
  Filled 2017-09-24: qty 5

## 2017-09-24 MED ORDER — ROCURONIUM BROMIDE 100 MG/10ML IV SOLN
INTRAVENOUS | Status: DC | PRN
  Administered 2017-09-24: 40 mg via INTRAVENOUS

## 2017-09-24 MED ORDER — SUGAMMADEX SODIUM 200 MG/2ML IV SOLN
INTRAVENOUS | Status: DC | PRN
  Administered 2017-09-24: 200 mg via INTRAVENOUS

## 2017-09-24 MED ORDER — SCOPOLAMINE 1 MG/3DAYS TD PT72
1.0000 | MEDICATED_PATCH | Freq: Once | TRANSDERMAL | Status: DC
  Administered 2017-09-24: 1.5 mg via TRANSDERMAL

## 2017-09-24 MED ORDER — MEPERIDINE HCL 50 MG/ML IJ SOLN: 6.2500 mg | INTRAMUSCULAR | Status: DC | PRN

## 2017-09-24 SURGICAL SUPPLY — 37 items
APPLIER CLIP ROT 10 11.4 M/L (STAPLE) ×3
CANISTER SUCT 1200ML W/VALVE (MISCELLANEOUS) ×3 IMPLANT
CANNULA DILATOR 10 W/SLV (CANNULA) ×2 IMPLANT
CANNULA DILATOR 10MM W/SLV (CANNULA) ×1
CATH REDDICK CHOLANGI 4FR 50CM (CATHETERS) ×3 IMPLANT
CHLORAPREP W/TINT 26ML (MISCELLANEOUS) ×3 IMPLANT
CLIP APPLIE ROT 10 11.4 M/L (STAPLE) ×1 IMPLANT
CLOSURE WOUND 1/2 X4 (GAUZE/BANDAGES/DRESSINGS) ×1
DRAPE SHEET LG 3/4 BI-LAMINATE (DRAPES) ×3 IMPLANT
ELECT REM PT RETURN 9FT ADLT (ELECTROSURGICAL) ×3
ELECTRODE REM PT RTRN 9FT ADLT (ELECTROSURGICAL) ×1 IMPLANT
GAUZE SPONGE 4X4 12PLY STRL (GAUZE/BANDAGES/DRESSINGS) ×3 IMPLANT
GLOVE BIO SURGEON STRL SZ7.5 (GLOVE) ×12 IMPLANT
GOWN STRL REUS W/ TWL LRG LVL3 (GOWN DISPOSABLE) ×3 IMPLANT
GOWN STRL REUS W/TWL LRG LVL3 (GOWN DISPOSABLE) ×6
IRRIGATION STRYKERFLOW (MISCELLANEOUS) ×1 IMPLANT
IRRIGATOR STRYKERFLOW (MISCELLANEOUS) ×3
IV NS 1000ML (IV SOLUTION) ×2
IV NS 1000ML BAXH (IV SOLUTION) ×1 IMPLANT
KIT RM TURNOVER STRD PROC AR (KITS) ×3 IMPLANT
LABEL OR SOLS (LABEL) ×3 IMPLANT
NDL INSUFF ACCESS 14 VERSASTEP (NEEDLE) ×3 IMPLANT
NEEDLE FILTER BLUNT 18X 1/2SAF (NEEDLE) ×2
NEEDLE FILTER BLUNT 18X1 1/2 (NEEDLE) ×1 IMPLANT
NS IRRIG 500ML POUR BTL (IV SOLUTION) ×3 IMPLANT
PACK LAP CHOLECYSTECTOMY (MISCELLANEOUS) ×3 IMPLANT
SCISSORS METZENBAUM CVD 33 (INSTRUMENTS) ×3 IMPLANT
SEAL FOR SCOPE WARMER C3101 (MISCELLANEOUS) IMPLANT
SLEEVE ENDOPATH XCEL 5M (ENDOMECHANICALS) ×3 IMPLANT
STRIP CLOSURE SKIN 1/2X4 (GAUZE/BANDAGES/DRESSINGS) ×2 IMPLANT
SUT CHROMIC 5 0 RB 1 27 (SUTURE) ×3 IMPLANT
SUT VIC AB 0 CT2 27 (SUTURE) IMPLANT
SYR 3ML LL SCALE MARK (SYRINGE) ×3 IMPLANT
TROCAR XCEL NON-BLD 11X100MML (ENDOMECHANICALS) ×3 IMPLANT
TROCAR XCEL NON-BLD 5MMX100MML (ENDOMECHANICALS) ×3 IMPLANT
TUBING INSUFFLATOR HI FLOW (MISCELLANEOUS) ×3 IMPLANT
WATER STERILE IRR 1000ML POUR (IV SOLUTION) ×3 IMPLANT

## 2017-09-24 NOTE — Anesthesia Procedure Notes (Signed)
Procedure Name: Intubation Date/Time: 09/24/2017 7:36 AM Performed by: Doreen Salvage Pre-anesthesia Checklist: Patient identified, Patient being monitored, Timeout performed, Emergency Drugs available and Suction available Patient Re-evaluated:Patient Re-evaluated prior to induction Oxygen Delivery Method: Circle system utilized Preoxygenation: Pre-oxygenation with 100% oxygen Induction Type: IV induction Ventilation: Mask ventilation without difficulty Laryngoscope Size: Mac and 3 Grade View: Grade I Tube type: Oral Tube size: 7.0 mm Number of attempts: 1 Airway Equipment and Method: Stylet Placement Confirmation: ETT inserted through vocal cords under direct vision,  positive ETCO2 and breath sounds checked- equal and bilateral Secured at: 21 cm Tube secured with: Tape Dental Injury: Teeth and Oropharynx as per pre-operative assessment

## 2017-09-24 NOTE — Transfer of Care (Signed)
Immediate Anesthesia Transfer of Care Note  Patient: Jamie Sheppard  Procedure(s) Performed: Procedure(s): LAPAROSCOPIC CHOLECYSTECTOMY (N/A)  Patient Location: PACU  Anesthesia Type:General  Level of Consciousness: sedated  Airway & Oxygen Therapy: Patient Spontanous Breathing and Patient connected to face mask oxygen  Post-op Assessment: Report given to RN and Post -op Vital signs reviewed and stable  Post vital signs: Reviewed and stable  Last Vitals:  Vitals:   09/24/17 0622 09/24/17 0846  BP: 114/63 101/65  Pulse: 81 71  Resp: 14 16  Temp: 36.8 C 36.5 C  SpO2: 100% 98%    Complications: No apparent anesthesia complications

## 2017-09-24 NOTE — Anesthesia Postprocedure Evaluation (Signed)
Anesthesia Post Note  Patient: Jamie Sheppard  Procedure(s) Performed: LAPAROSCOPIC CHOLECYSTECTOMY (N/A )  Patient location during evaluation: PACU Anesthesia Type: General Level of consciousness: awake and alert and oriented Pain management: pain level controlled Vital Signs Assessment: post-procedure vital signs reviewed and stable Respiratory status: spontaneous breathing, nonlabored ventilation and respiratory function stable Cardiovascular status: blood pressure returned to baseline and stable Postop Assessment: no signs of nausea or vomiting Anesthetic complications: no     Last Vitals:  Vitals:   09/24/17 0901 09/24/17 0916  BP: 108/69 (!) 96/58  Pulse: 87 77  Resp: (!) 22 (!) 24  Temp:    SpO2: 96% 91%    Last Pain:  Vitals:   09/24/17 0916  TempSrc:   PainSc: 0-No pain                 Rion Catala

## 2017-09-24 NOTE — Op Note (Signed)
OPERATIVE REPORT  PREOPERATIVE DIAGNOSIS:  Chronic cholecystitis   POSTOPERATIVE DIAGNOSIS: Chronic cholecystitis cholelithiasis  PROCEDURE: Laparoscopic cholecystectomy   ANESTHESIA: General  SURGEON: Renda Rolls M.D.  INDICATIONS: She has a history of right upper quadrant pains. Ultrasound demonstrated no gallstones. Hepatobiliary scan was with a low gallbladder ejection fraction of 29%. Surgery was recommended for definitive treatment.    With the patient on the operating table in the supine position under general endotracheal anesthesia the abdomen was prepared with ChloraPrep solution and draped in a sterile manner. A short incision was made in the inferior aspect of the umbilicus and carried down to the deep fascia which was grasped with a laryngeal hook. A Veress needle was inserted aspirated and irrigated with a saline solution. The peritoneal cavity was insufflated with carbon dioxide. The Veress needle was removed. The 10 mm cannula was inserted. The 10 mm 0 laparoscope was inserted to view the peritoneal cavity.  Another incision was made in the epigastrium slightly to the right of the midline to introduce an 11 mm cannula. 2 incisions were made in the lateral aspect of the right upper quadrant to introduce 2   5 mm cannulas. Initial inspection revealed a smooth surface of the liver.  The gallbladder was retracted towards the right shoulder. There was atypical appearance of cholesterolosis The gallbladder neck was retracted inferiorly and laterally.  The porta hepatis was identified. The gallbladder was mobilized with incision of the visceral peritoneum. The cystic duct was dissected free from surrounding structures. The cystic artery was dissected free from surrounding structures. A critical view of safety was demonstrated  An Endo Clip was placed across the cystic duct adjacent to the gallbladder neck. An incision was made in the cystic duct to introduce a Reddick catheter. The  cystic duct was very small and the catheter would not thread into the cystic duct therefore colonogram was not done. The Reddick catheter was removed. The cystic duct was doubly ligated with endoclips and divided. The cystic artery was controlled with double endoclips and divided. The gallbladder was dissected free from the liver with use of hook and cautery and blunt dissection. Bleeding was minimal and hemostasis was intact. The gallbladder was delivered up through the infraumbilical incision opened and suctioned. The gallbladder was removed and did contain a small palpable stone and was submitted in formalin for routine pathology. Blood loss for the operation was less than 1 cc. The cannulas were removed allowing carbon dioxide to escape from the peritoneal cavity The skin incisions were closed with interrupted 5-0 chromic subcutaneous suture benzoin and Steri-Strips. Gauze dressings were applied with paper tape.  The patient appeared to be in satisfactory condition and was prepared for transfer to the recovery room  Renda Rolls M.D.

## 2017-09-24 NOTE — Discharge Instructions (Addendum)
AMBULATORY SURGERY  DISCHARGE INSTRUCTIONS   1) The drugs that you were given will stay in your system until tomorrow so for the next 24 hours you should not:  A) Drive an automobile B) Make any legal decisions C) Drink any alcoholic beverage   2) You may resume regular meals tomorrow.  Today it is better to start with liquids and gradually work up to solid foods.  You may eat anything you prefer, but it is better to start with liquids, then soup and crackers, and gradually work up to solid foods.   3) Please notify your doctor immediately if you have any unusual bleeding, trouble breathing, redness and pain at the surgery site, drainage, fever, or pain not relieved by medication.    4) Additional Instructions:        Please contact your physician with any problems or Same Day Surgery at [phone], Monday through Friday 6 am to 4 pm, or [place] at [facility] number at [phone].Take Tylenol and/or tramadol as needed for pain.  Should not drive or do anything dangerous when taking tramadol.  Remove dressings on Wednesday. May shower Thursday.

## 2017-09-24 NOTE — H&P (Signed)
  She comes today prepared for laparoscopic cholecystectomy.  She reports no change in condition since office exam.  Labs noted  Discussed plan for surgery.

## 2017-09-24 NOTE — OR Nursing (Signed)
Nausea resolved per patient, desires discharge

## 2017-09-24 NOTE — Anesthesia Preprocedure Evaluation (Signed)
Anesthesia Evaluation  Patient identified by MRN, date of birth, ID band Patient awake    Reviewed: Allergy & Precautions, NPO status , Patient's Chart, lab work & pertinent test results  History of Anesthesia Complications Negative for: history of anesthetic complications  Airway Mallampati: III  TM Distance: >3 FB Neck ROM: Full    Dental no notable dental hx.    Pulmonary neg pulmonary ROS, neg sleep apnea, neg COPD,    breath sounds clear to auscultation- rhonchi (-) wheezing      Cardiovascular Exercise Tolerance: Good (-) hypertension(-) CAD, (-) Past MI and (-) Cardiac Stents  Rhythm:Regular Rate:Normal - Systolic murmurs and - Diastolic murmurs    Neuro/Psych  Headaches, Anxiety    GI/Hepatic Neg liver ROS, GERD  ,  Endo/Other  negative endocrine ROSneg diabetes  Renal/GU negative Renal ROS     Musculoskeletal negative musculoskeletal ROS (+)   Abdominal (+) + obese,   Peds  Hematology negative hematology ROS (+)   Anesthesia Other Findings Past Medical History: No date: Anxiety No date: Dizziness No date: GERD (gastroesophageal reflux disease)     Comment:  occ-no meds No date: Headache     Comment:  MIGRAINES-HORMONE RELATED   Reproductive/Obstetrics                             Anesthesia Physical Anesthesia Plan  ASA: II  Anesthesia Plan: General   Post-op Pain Management:    Induction: Intravenous  PONV Risk Score and Plan: 2 and Ondansetron, Dexamethasone and Scopolamine patch - Pre-op  Airway Management Planned: Oral ETT  Additional Equipment:   Intra-op Plan:   Post-operative Plan: Extubation in OR  Informed Consent: I have reviewed the patients History and Physical, chart, labs and discussed the procedure including the risks, benefits and alternatives for the proposed anesthesia with the patient or authorized representative who has indicated his/her  understanding and acceptance.   Dental advisory given  Plan Discussed with: CRNA and Anesthesiologist  Anesthesia Plan Comments:         Anesthesia Quick Evaluation

## 2017-09-24 NOTE — Anesthesia Post-op Follow-up Note (Signed)
Anesthesia QCDR form completed.        

## 2017-09-25 LAB — SURGICAL PATHOLOGY

## 2017-12-09 ENCOUNTER — Other Ambulatory Visit: Payer: Self-pay | Admitting: Certified Nurse Midwife

## 2017-12-09 DIAGNOSIS — N6320 Unspecified lump in the left breast, unspecified quadrant: Secondary | ICD-10-CM

## 2017-12-18 ENCOUNTER — Ambulatory Visit
Admission: RE | Admit: 2017-12-18 | Discharge: 2017-12-18 | Disposition: A | Discharge status: Home / Self Care | Payer: BC Managed Care – PPO | Source: Ambulatory Visit | Attending: Certified Nurse Midwife | Admitting: Certified Nurse Midwife

## 2017-12-18 DIAGNOSIS — N6002 Solitary cyst of left breast: Secondary | ICD-10-CM | POA: Diagnosis not present

## 2017-12-18 DIAGNOSIS — N6320 Unspecified lump in the left breast, unspecified quadrant: Secondary | ICD-10-CM

## 2017-12-23 ENCOUNTER — Telehealth: Payer: Self-pay | Admitting: Obstetrics and Gynecology

## 2017-12-23 NOTE — Telephone Encounter (Signed)
Chart reviewed.

## 2018-08-16 IMAGING — MG MM DIGITAL DIAGNOSTIC UNILAT*L* W/ TOMO W/ CAD
6 of 9 series · 6 of 21 positions shown · non-contrast
Comparison: Previous exam(s).

CLINICAL DATA: Patient presents for a diagnostic left breast
examination due to a palpable abnormality over the upper central
breast which she states feels different and harder/bigger. Patient
had a recent diagnostic left breast examination 08/12/2017
demonstrating a large simple cyst in this area.

EXAM:
2D DIGITAL DIAGNOSTIC left MAMMOGRAM WITH CAD AND ADJUNCT TOMO
ULTRASOUND left BREAST

[L MLO synth-2D (1 of 2)]
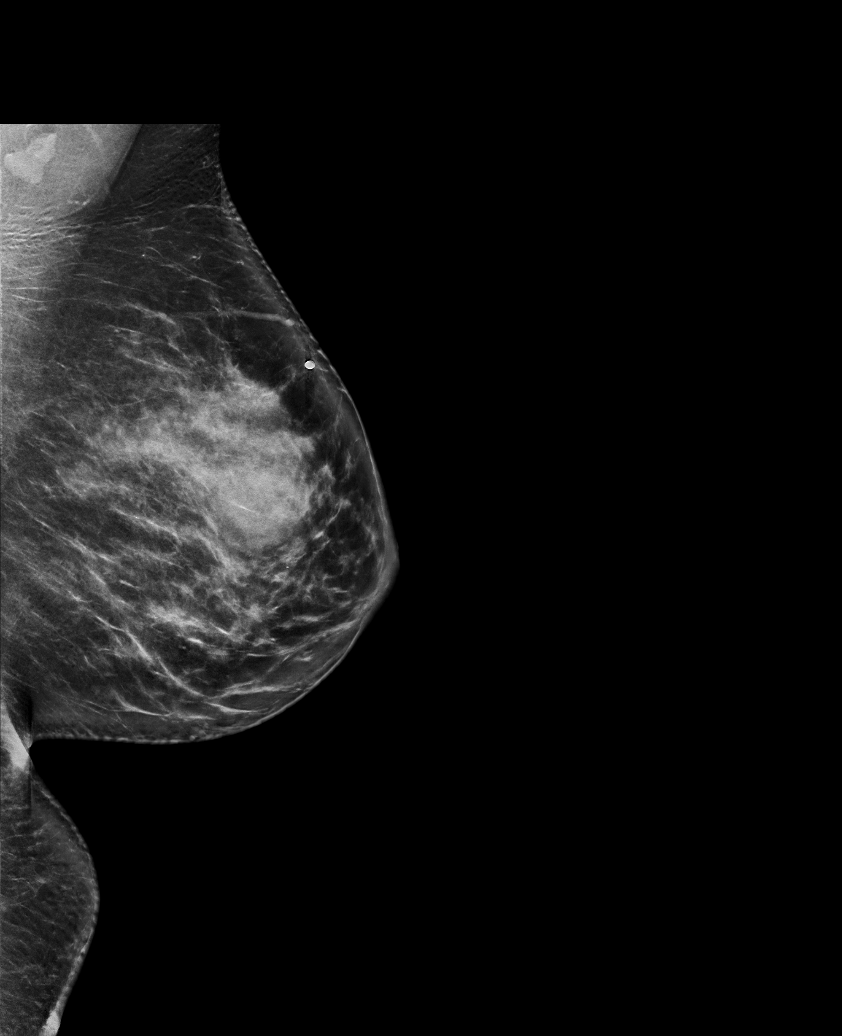

[L MLO synth-2D (2 of 2)]
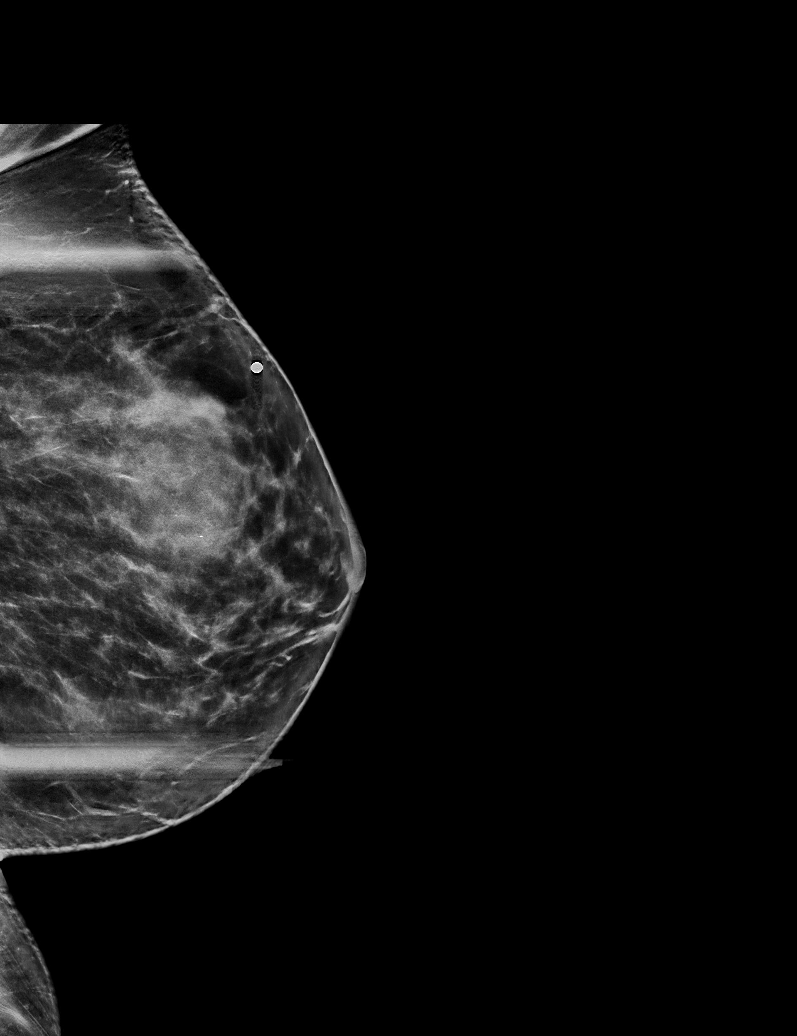

[L CC]
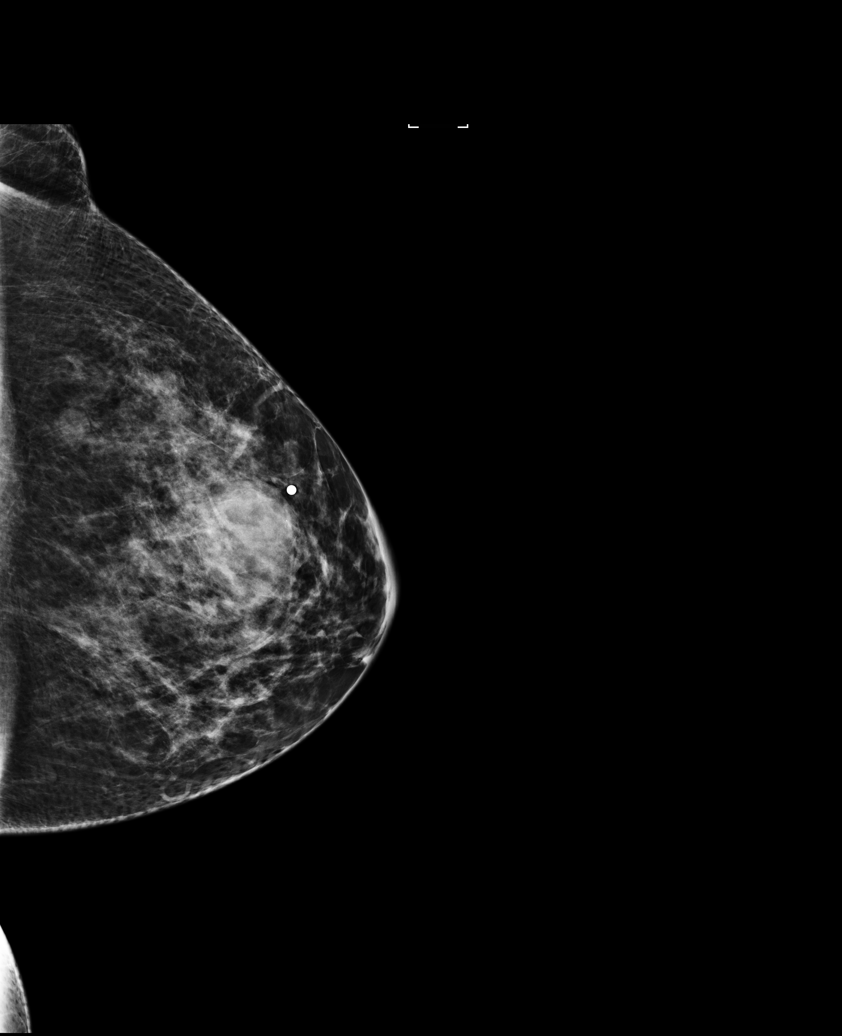

[L MLO (1 of 2)]
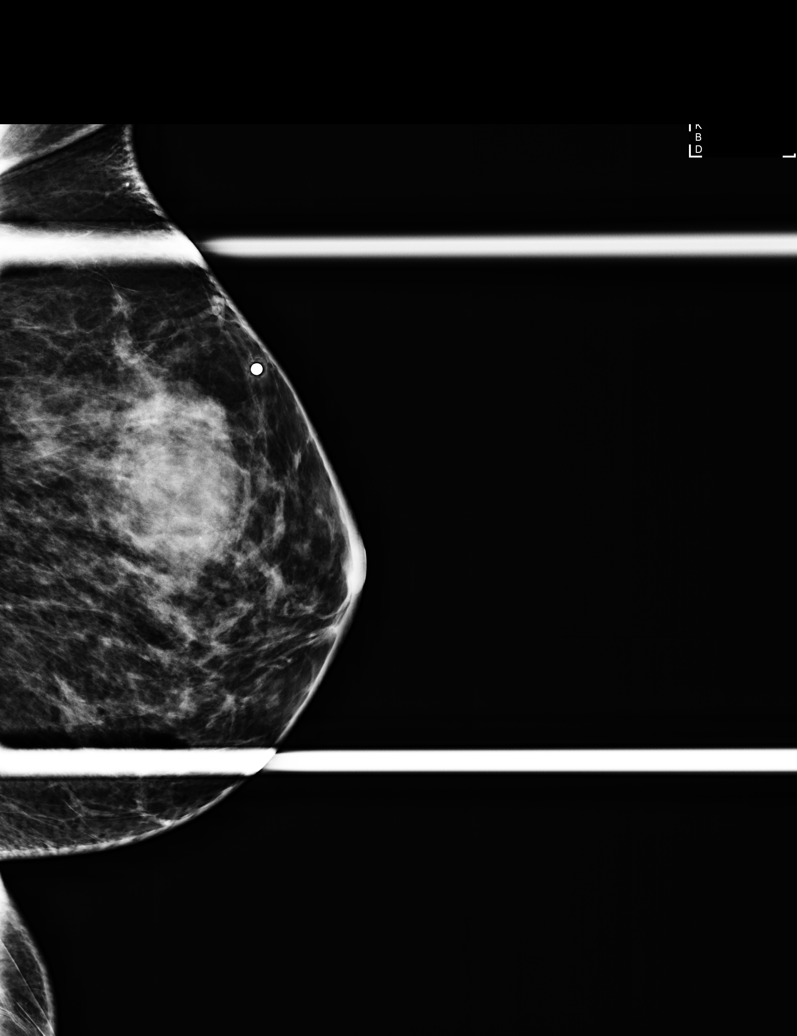

[L CC synth-2D]
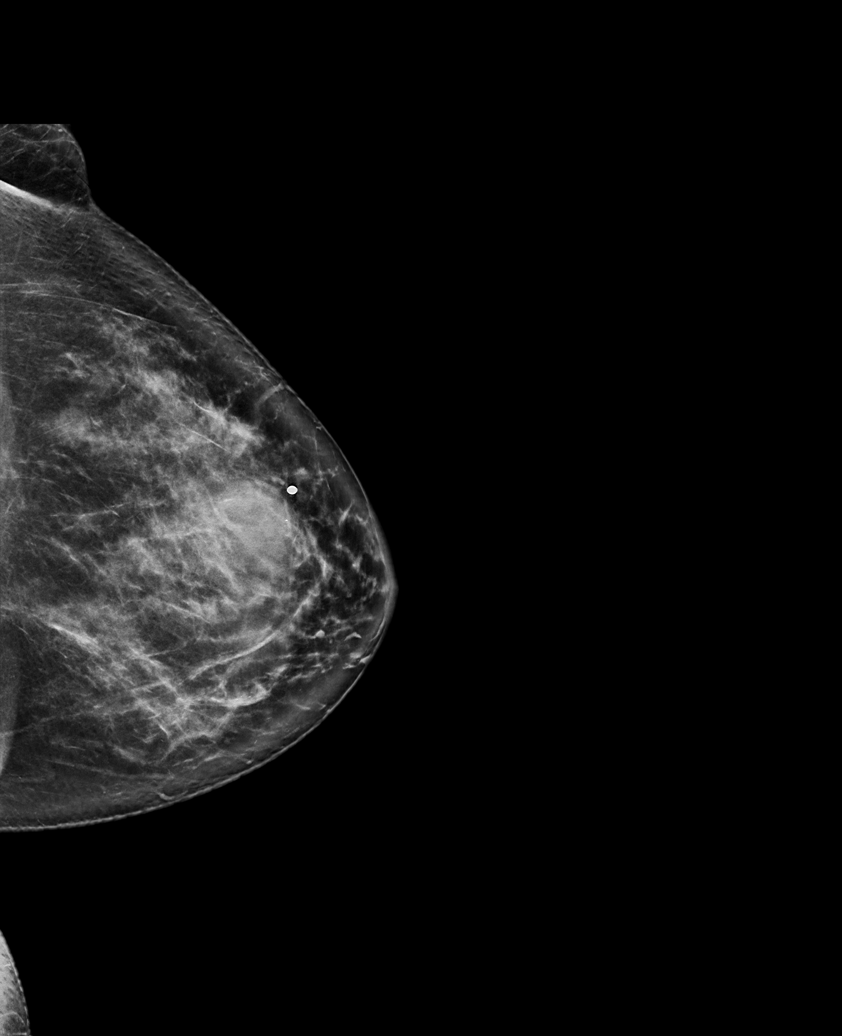

[L MLO (2 of 2)]
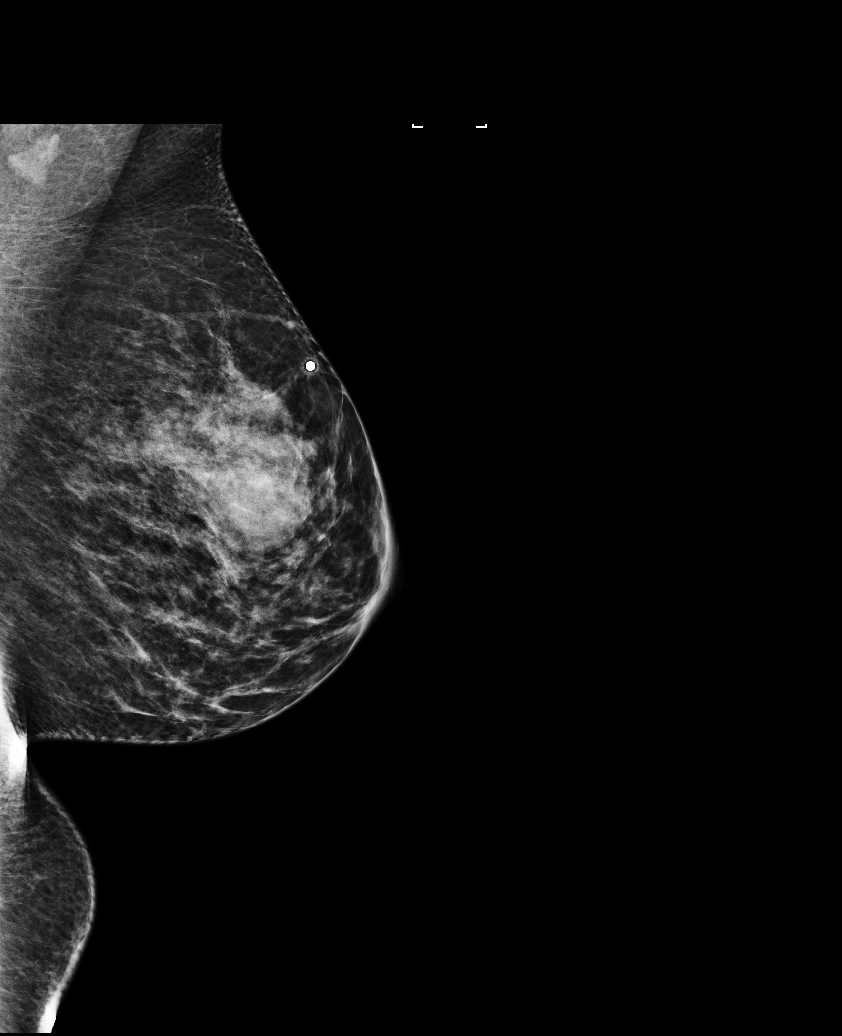

[6 of 21 positions shown; findings below may reference images not displayed]

ACR Breast Density Category c: The breast tissue is heterogeneously
dense, which may obscure small masses.
FINDINGS: Examination demonstrates an oval circumscribed mass over the upper
outer left breast corresponding to patient's palpable abnormality
and and unchanged from the prior exam at which time it was
documented to represent a simple cyst. Remainder of the exam is
unchanged.

Mammographic images were processed with CAD.

Targeted ultrasound is performed, showing a simple cyst over the
[DATE] position of the left breast 1 cm from the nipple measuring
x 2.5 x 3.4 cm which is slightly smaller than previously (1.4 x
x 3.6 cm). No other focal abnormality in this area.
IMPRESSION: Known simple cyst without significant change over the [DATE] position
of the left breast 1 cm from the nipple measuring 2.2 x 2.5 x 3.4 cm
corresponding to patient's palpable abnormality.

RECOMMENDATION:
Recommend continued annual bilateral screening mammographic
followup. Possible ultrasound-guided cyst aspiration was discussed
if this becomes too painful/tender.

I have discussed the findings and recommendations with the patient.
Results were also provided in writing at the conclusion of the
visit. If applicable, a reminder letter will be sent to the patient
regarding the next appointment.

BI-RADS CATEGORY  2: Benign.

## 2019-02-22 IMAGING — US US BREAST*L* LIMITED INC AXILLA
1 series · 8 of 8 positions shown · non-contrast
Comparison: Previous exam(s).

CLINICAL DATA: Patient presents for a diagnostic left breast
examination due to a palpable abnormality over the upper central
breast which she states feels different and harder/bigger. Patient
had a recent diagnostic left breast examination 08/12/2017
demonstrating a large simple cyst in this area.

EXAM:
2D DIGITAL DIAGNOSTIC left MAMMOGRAM WITH CAD AND ADJUNCT TOMO
ULTRASOUND left BREAST

[Series 1: us breast*left* limited inc axilla · 0.07mm/px · 8 of 8 slices shown]
[im 1/8]
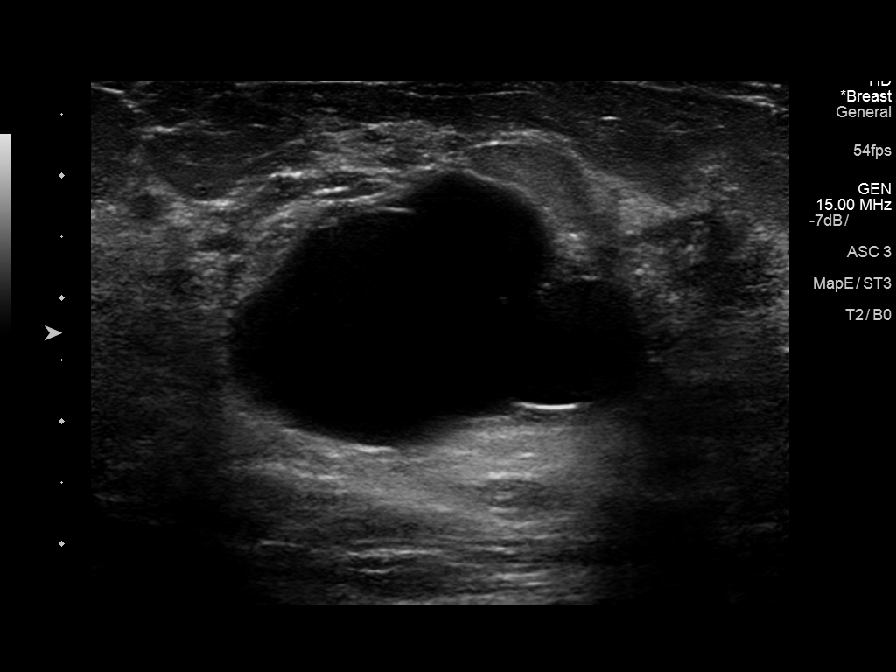
[im 2/8]
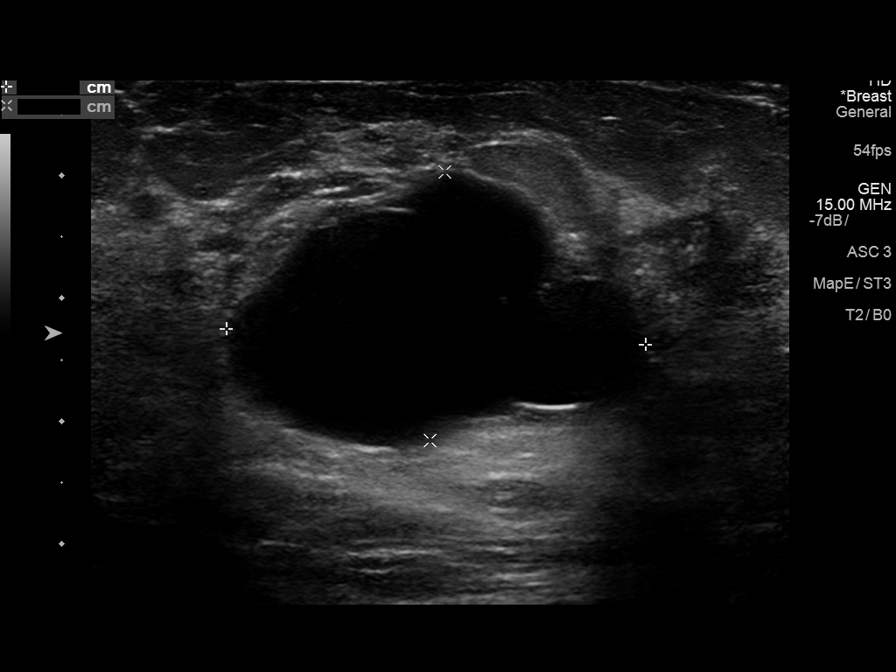
[im 3/8]
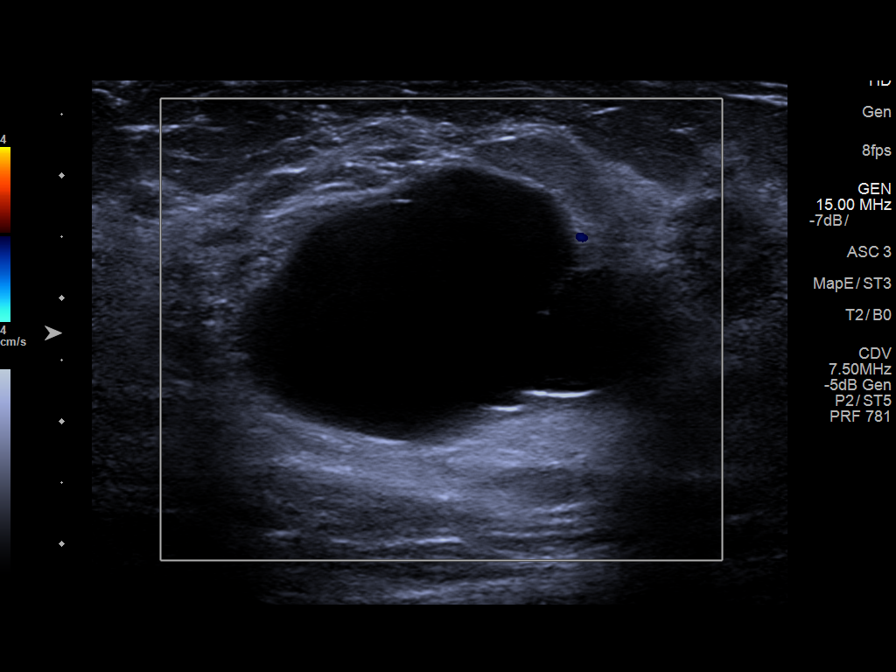
[im 4/8]
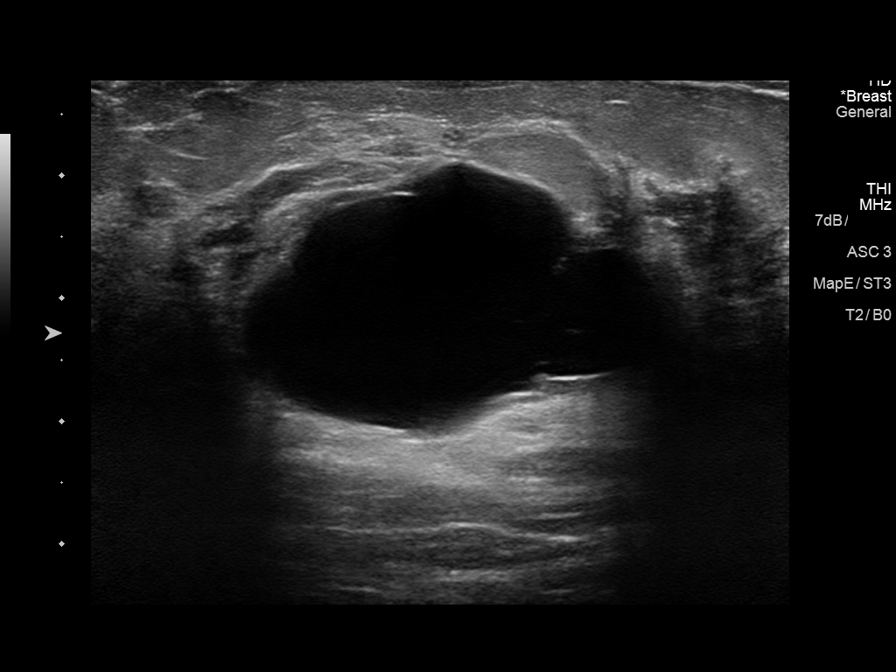
[im 5/8]
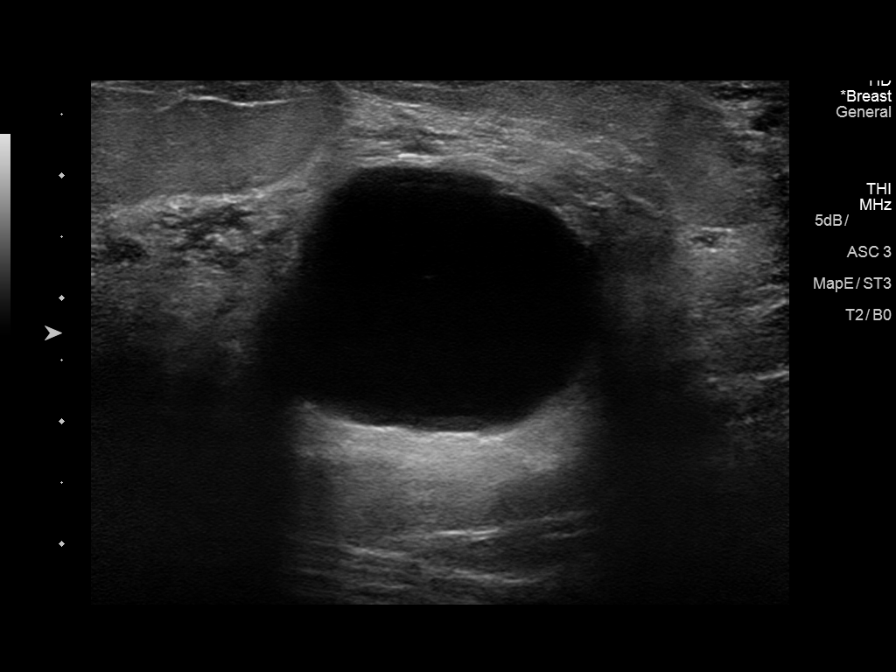
[im 6/8]
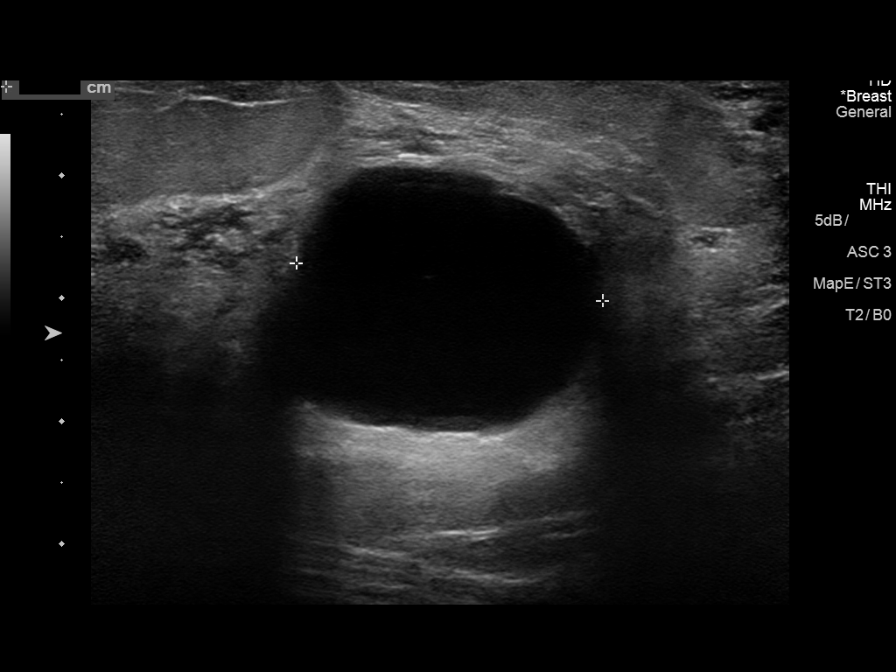
[im 7/8]
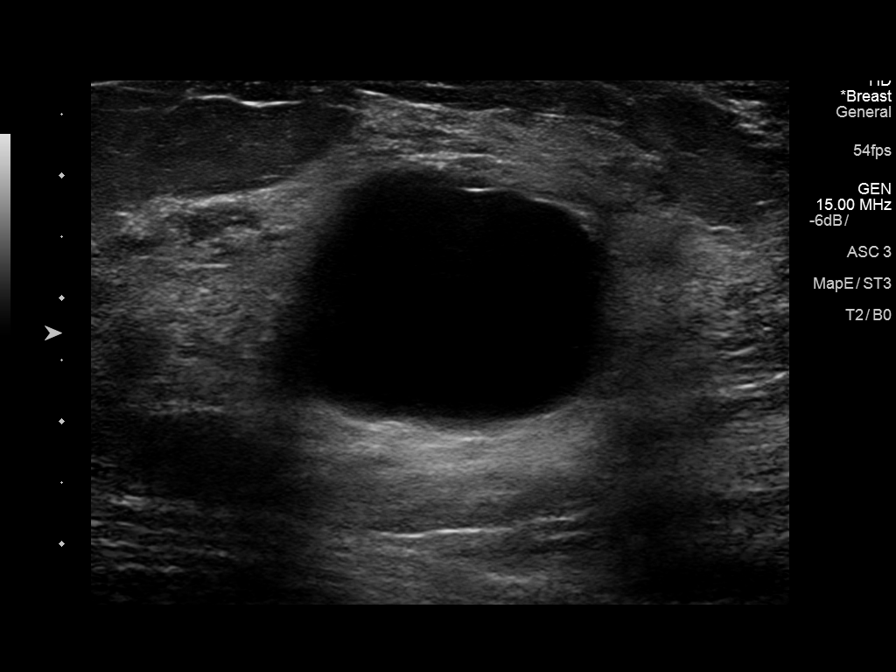
[im 8/8]
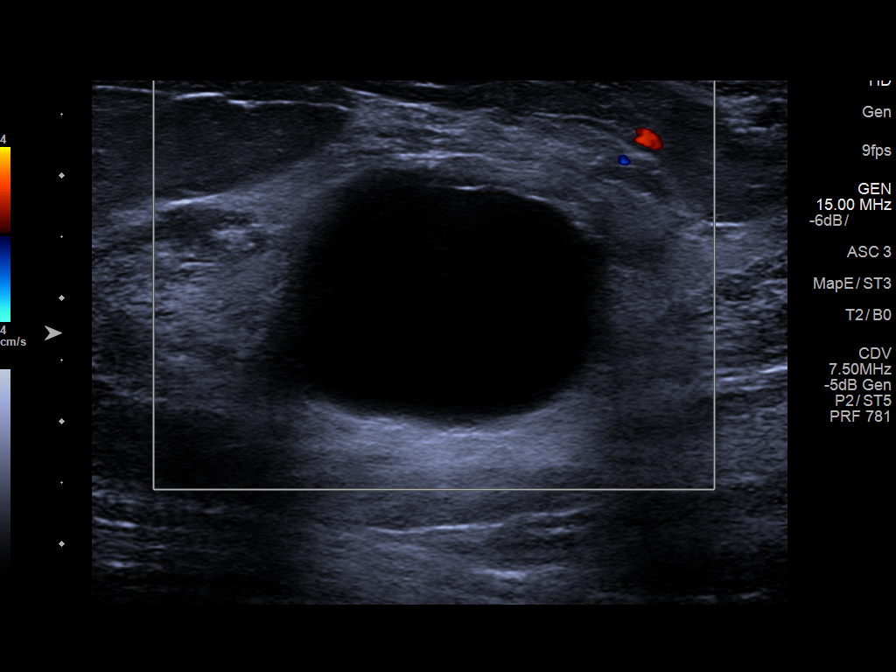

[8 of 8 positions shown; findings below may reference images not displayed]

ACR Breast Density Category c: The breast tissue is heterogeneously
dense, which may obscure small masses.
FINDINGS: Examination demonstrates an oval circumscribed mass over the upper
outer left breast corresponding to patient's palpable abnormality
and and unchanged from the prior exam at which time it was
documented to represent a simple cyst. Remainder of the exam is
unchanged.

Mammographic images were processed with CAD.

Targeted ultrasound is performed, showing a simple cyst over the
[DATE] position of the left breast 1 cm from the nipple measuring
x 2.5 x 3.4 cm which is slightly smaller than previously (1.4 x
x 3.6 cm). No other focal abnormality in this area.
IMPRESSION: Known simple cyst without significant change over the [DATE] position
of the left breast 1 cm from the nipple measuring 2.2 x 2.5 x 3.4 cm
corresponding to patient's palpable abnormality.

RECOMMENDATION:
Recommend continued annual bilateral screening mammographic
followup. Possible ultrasound-guided cyst aspiration was discussed
if this becomes too painful/tender.

I have discussed the findings and recommendations with the patient.
Results were also provided in writing at the conclusion of the
visit. If applicable, a reminder letter will be sent to the patient
regarding the next appointment.

BI-RADS CATEGORY  2: Benign.

## 2020-03-15 ENCOUNTER — Encounter: Payer: Self-pay | Admitting: Urology

## 2020-03-15 ENCOUNTER — Ambulatory Visit: Payer: BC Managed Care – PPO | Admitting: Urology

## 2020-03-15 ENCOUNTER — Other Ambulatory Visit: Payer: Self-pay

## 2020-03-15 VITALS — BP 125/79 | HR 105 | Ht 61.0 in | Wt 163.0 lb

## 2020-03-15 DIAGNOSIS — R3129 Other microscopic hematuria: Secondary | ICD-10-CM | POA: Diagnosis not present

## 2020-03-15 DIAGNOSIS — R310 Gross hematuria: Secondary | ICD-10-CM

## 2020-03-15 LAB — URINALYSIS, COMPLETE
Bilirubin, UA: NEGATIVE
Glucose, UA: NEGATIVE
Ketones, UA: NEGATIVE
Leukocytes,UA: NEGATIVE
Nitrite, UA: NEGATIVE
Protein,UA: NEGATIVE
Specific Gravity, UA: 1.025 (ref 1.005–1.030)
Urobilinogen, Ur: 0.2 mg/dL (ref 0.2–1.0)
pH, UA: 7 (ref 5.0–7.5)

## 2020-03-15 LAB — MICROSCOPIC EXAMINATION: Bacteria, UA: NONE SEEN

## 2020-03-15 NOTE — Progress Notes (Signed)
03/15/2020 3:30 PM  Jamie Sheppard Dec 03, 1981 [id number]  Referring provider: Barbette Reichmann, MD [address] [facility] [place],  [place] [address]  Chief Complaint  Patient presents with  . Hematuria  Microscopic Hematuria  HPI:  Jamie Sheppard is a 39 yo F who presents with microscopic hematuria.  Pt has a hx of hematuria dating back to 2017.    RBC found in urine ranging from 4-10 (A) in 2018 up to 10-50 (A) in 2019.  Non contrast CT scan from 2018 was unremarkable.   Pt reports of urinary frequency and constipation.   Pt denies having a urology visit prior to today.  Pt denies gross hematuria, kidney stone, UTI and flank pain.  Pt reports of normal menstruation and denies currently menstruating.  Pt denies family hx of bladder and kidney cancer.   Pt denies hx of smoking.  PMH: Past Medical History:  Diagnosis Date  . Anxiety   . Dizziness   . GERD (gastroesophageal reflux disease)    occ-no meds  . Headache    MIGRAINES-HORMONE RELATED    Surgical History: Past Surgical History:  Procedure Laterality Date  . CHOLECYSTECTOMY N/A 09/24/2017   Procedure: LAPAROSCOPIC CHOLECYSTECTOMY;  Surgeon: Nadeen Landau, MD;  Location: [facility] ORS;  Service: General;  Laterality: N/A;  . GANGLION CYST EXCISION Left 03/25/2017   Procedure: REMOVAL GANGLION OF WRIST;  Surgeon: Erin Sons, MD;  Location: [facility] ORS;  Service: Orthopedics;  Laterality: Left;    Home Medications:  Allergies as of 03/15/2020      Reactions   Shellfish Allergy Anaphylaxis, Shortness Of Breath, Rash   Latex Rash   Spironolactone Rash      Medication List       Accurate as of March 15, 2020  7:14 PM. If you have any questions, ask your nurse or doctor.        STOP taking these medications   meloxicam 7.5 MG tablet Commonly known as: MOBIC Stopped by: Vanna Scotland, MD   traMADol 50 MG tablet Commonly known as: Ultram Stopped by: Vanna Scotland, MD     TAKE these medications   buPROPion 300 MG 24 hr tablet Commonly known as: WELLBUTRIN XL Take 300 mg by mouth at bedtime.   cetirizine 10 MG tablet Commonly known as: ZYRTEC Take 10 mg by mouth as needed for allergies.   fluticasone 50 MCG/ACT nasal spray Commonly known as: FLONASE Place 2 sprays into both nostrils daily.   meclizine 25 MG tablet Commonly known as: ANTIVERT Take 25 mg by mouth 3 (three) times daily as needed for dizziness.   nortriptyline 10 MG capsule Commonly known as: PAMELOR Take 10 mg by mouth at bedtime.   omeprazole 40 MG capsule Commonly known as: PRILOSEC Take 40 mg by mouth 2 (two) times daily.   ondansetron 4 MG tablet Commonly known as: ZOFRAN Take 4 mg by mouth every 8 (eight) hours as needed for nausea or vomiting.   vitamin B-12 1000 MCG tablet Commonly known as: CYANOCOBALAMIN Take 1,000 mcg by mouth daily.       Allergies:  Allergies  Allergen Reactions  . Shellfish Allergy Anaphylaxis, Shortness Of Breath and Rash  . Latex Rash  . Spironolactone Rash    Family History: Family History  Problem Relation Age of Onset  . Breast cancer Neg Hx     Social History:  reports that she has never smoked. She has never used smokeless tobacco. She reports that she does  not drink alcohol or use drugs.   Physical Exam: BP 125/79   Pulse (!) 105   Ht 5\' 1"  (1.549 m)   Wt 163 lb (73.9 kg)   BMI 30.80 kg/m   Constitutional:  Alert and oriented, No acute distress. HEENT: [place] AT, moist mucus membranes.  Trachea midline, no masses. Cardiovascular: No clubbing, cyanosis, or edema. Respiratory: Normal respiratory effort, no increased work of breathing. Skin: No rashes, bruises or suspicious lesions. Neurologic: Grossly intact, no focal deficits, moving all 4 extremities. Psychiatric: Normal mood and affect.  Laboratory Data: Urinalysis: Trace blood on dip at 3-10 RBC microscopically.  Assessment & Plan:    1.  Microscopic Hematuria  We discussed the differential diagnosis for microscopic hematuria, including nephrolithiasis, renal or upper tract tumors, bladder stones, UTIs, or bladder tumors as well as undetermined etiologies.  Per AUA guidelines, I did recommend modified microscopic hematuria evaluation.Pt is categorized in the low risk group meets all criteria based.  I recommend cystoscopy and renal ultrasound given that hematuria has persisted since 2018.   [facility] [address], [place], [place] [address] [phone]  I, Noland Fordyce, am acting as a medical scribe for Dr. Hollice Espy.  I have reviewed the above documentation for accuracy and completeness, and I agree with the above.   Hollice Espy, MD

## 2020-03-15 NOTE — Patient Instructions (Signed)

## 2020-03-29 ENCOUNTER — Ambulatory Visit
Admission: RE | Admit: 2020-03-29 | Discharge: 2020-03-29 | Disposition: A | Discharge status: Home / Self Care | Payer: BC Managed Care – PPO | Source: Ambulatory Visit | Attending: Urology | Admitting: Urology

## 2020-03-29 ENCOUNTER — Other Ambulatory Visit: Payer: Self-pay

## 2020-03-29 DIAGNOSIS — R3129 Other microscopic hematuria: Secondary | ICD-10-CM | POA: Diagnosis present

## 2020-03-29 DIAGNOSIS — R310 Gross hematuria: Secondary | ICD-10-CM | POA: Diagnosis present

## 2020-04-12 ENCOUNTER — Other Ambulatory Visit: Payer: Self-pay

## 2020-04-12 ENCOUNTER — Ambulatory Visit: Payer: BC Managed Care – PPO | Admitting: Urology

## 2020-04-12 VITALS — BP 114/73 | HR 94 | Ht 62.0 in | Wt 170.0 lb

## 2020-04-12 DIAGNOSIS — R3129 Other microscopic hematuria: Secondary | ICD-10-CM | POA: Diagnosis not present

## 2020-04-12 NOTE — Progress Notes (Signed)
   04/12/20  CC:  Chief Complaint  Patient presents with  . Cysto    HPI: Jamie Sheppard is a 39 y.o. with hx of microscopic hematuria returns today for cysto.   Pt has a hx of hematuria dating back to 2017.  RBC found in urine ranging from 4-10 (A) in 2018 up to 10-50 (A) in 2019.  Non contrast CT scan from 2018 was unremarkable.   UA from 03/15/20 revealed 3-10 RBC.   RUS from 03/29/20 indicated no evidence of disease.   Blood pressure 114/73, pulse 94, height 5\' 2"  (1.575 m), weight 170 lb (77.1 kg), last menstrual period 03/20/2020. NED. A&Ox3.   No respiratory distress   Abd soft, NT, ND Normal external genitalia with patent urethral meatus  Cystoscopy Procedure Note  Patient identification was confirmed, informed consent was obtained, and patient was prepped using Betadine solution.  Lidocaine jelly was administered per urethral meatus.    Procedure: - Flexible cystoscope introduced, without any difficulty.   - Thorough search of the bladder revealed:    normal urethral meatus    normal urothelium    no stones    no ulcers     no tumors    no urethral polyps    no trabeculation  - Ureteral orifices were normal in position and appearance.  Post-Procedure: - Patient tolerated the procedure well  Pertinent Imagings: CLINICAL DATA:  Initial evaluation for microscopic hematuria.  EXAM: RENAL / URINARY TRACT ULTRASOUND COMPLETE  COMPARISON:  Prior CT from 07/15/2017.  FINDINGS: Right Kidney:  Renal measurements: 11.4 x 3.7 x 5.3 cm = volume: 118 mL. Echogenicity within normal limits. No nephrolithiasis or hydronephrosis. No focal renal mass.  Left Kidney:  Renal measurements: 11.5 x 4.8 x 5.4 cm = volume: 154 mL. Echogenicity within normal limits. No nephrolithiasis or hydronephrosis. No focal renal mass.  Bladder:  Appears normal for degree of bladder distention. Bilateral jets  are visualized.  Other:  None.  IMPRESSION: Normal renal ultrasound.  No nephrolithiasis or hydronephrosis.   Electronically Signed   By: 07/17/2017 M.D.   On: 03/29/2020 20:46  I have personally reviewed the images and agree with radiologist interpretation.   Assessment/ Plan:  1. Microscopic hematuria  RUS/cysto negative  No reason for hematuria episode  Return in 2 years for cysto or sooner if hematuria persists or sooner with gross hematuria   Return in about 2 years (around 04/12/2022) for MD follow up CYSTO.  04/14/2022, am acting as a scribe for Dr. Solon Augusta,  I have reviewed the above documentation for accuracy and completeness, and I agree with the above.   Vanna Scotland, MD

## 2020-04-13 LAB — URINALYSIS, COMPLETE
Bilirubin, UA: NEGATIVE
Glucose, UA: NEGATIVE
Ketones, UA: NEGATIVE
Leukocytes,UA: NEGATIVE
Nitrite, UA: NEGATIVE
Protein,UA: NEGATIVE
Specific Gravity, UA: 1.025 (ref 1.005–1.030)
Urobilinogen, Ur: 0.2 mg/dL (ref 0.2–1.0)
pH, UA: 5 (ref 5.0–7.5)

## 2020-04-13 LAB — MICROSCOPIC EXAMINATION: Bacteria, UA: NONE SEEN

## 2021-08-15 ENCOUNTER — Other Ambulatory Visit: Payer: Self-pay

## 2021-08-15 DIAGNOSIS — Z1231 Encounter for screening mammogram for malignant neoplasm of breast: Secondary | ICD-10-CM

## 2021-08-31 ENCOUNTER — Ambulatory Visit
Admission: RE | Admit: 2021-08-31 | Discharge: 2021-08-31 | Disposition: A | Discharge status: Home / Self Care | Payer: BC Managed Care – PPO | Source: Ambulatory Visit

## 2021-08-31 ENCOUNTER — Other Ambulatory Visit: Payer: Self-pay

## 2021-08-31 DIAGNOSIS — Z1231 Encounter for screening mammogram for malignant neoplasm of breast: Secondary | ICD-10-CM | POA: Diagnosis present

## 2022-05-30 ENCOUNTER — Other Ambulatory Visit: Payer: Self-pay

## 2022-05-30 ENCOUNTER — Encounter: Payer: Self-pay | Admitting: Emergency Medicine

## 2022-05-30 ENCOUNTER — Emergency Department
Admission: EM | Admit: 2022-05-30 | Discharge: 2022-05-30 | Disposition: A | Discharge status: Home / Self Care | Payer: BC Managed Care – PPO | Attending: Emergency Medicine | Admitting: Emergency Medicine

## 2022-05-30 DIAGNOSIS — Z20822 Contact with and (suspected) exposure to covid-19: Secondary | ICD-10-CM | POA: Diagnosis not present

## 2022-05-30 DIAGNOSIS — R197 Diarrhea, unspecified: Secondary | ICD-10-CM | POA: Diagnosis present

## 2022-05-30 DIAGNOSIS — D72829 Elevated white blood cell count, unspecified: Secondary | ICD-10-CM | POA: Diagnosis not present

## 2022-05-30 DIAGNOSIS — R8289 Other abnormal findings on cytological and histological examination of urine: Secondary | ICD-10-CM | POA: Diagnosis not present

## 2022-05-30 DIAGNOSIS — R42 Dizziness and giddiness: Secondary | ICD-10-CM | POA: Insufficient documentation

## 2022-05-30 DIAGNOSIS — K529 Noninfective gastroenteritis and colitis, unspecified: Secondary | ICD-10-CM | POA: Insufficient documentation

## 2022-05-30 DIAGNOSIS — R7309 Other abnormal glucose: Secondary | ICD-10-CM | POA: Diagnosis not present

## 2022-05-30 LAB — COMPREHENSIVE METABOLIC PANEL
ALT: 33 U/L (ref 0–44)
AST: 34 U/L (ref 15–41)
Albumin: 4.8 g/dL (ref 3.5–5.0)
Alkaline Phosphatase: 50 U/L (ref 38–126)
Anion gap: 6 (ref 5–15)
BUN: 14 mg/dL (ref 6–20)
CO2: 23 mmol/L (ref 22–32)
Calcium: 9.1 mg/dL (ref 8.9–10.3)
Chloride: 109 mmol/L (ref 98–111)
Creatinine, Ser: 0.99 mg/dL (ref 0.44–1.00)
GFR, Estimated: >60 mL/min (ref >60)
Glucose, Bld: 163 mg/dL — ABNORMAL HIGH (ref 70–99)
Potassium: 3.7 mmol/L (ref 3.5–5.1)
Sodium: 138 mmol/L (ref 135–145)
Total Bilirubin: 0.4 mg/dL (ref 0.3–1.2)
Total Protein: 8.3 g/dL — ABNORMAL HIGH (ref 6.5–8.1)

## 2022-05-30 LAB — TROPONIN I (HIGH SENSITIVITY): Troponin I (High Sensitivity): <2 ng/L (ref <18)

## 2022-05-30 LAB — URINALYSIS, ROUTINE W REFLEX MICROSCOPIC
Bilirubin Urine: NEGATIVE
Glucose, UA: 50 mg/dL — AB
Ketones, ur: 5 mg/dL — AB
Leukocytes,Ua: NEGATIVE
Nitrite: NEGATIVE
Protein, ur: 100 mg/dL — AB
Specific Gravity, Urine: 1.031 — ABNORMAL HIGH (ref 1.005–1.030)
pH: 5 (ref 5.0–8.0)

## 2022-05-30 LAB — CBC
HCT: 41.9 % (ref 36.0–46.0)
Hemoglobin: 14.2 g/dL (ref 12.0–15.0)
MCH: 31 pg (ref 26.0–34.0)
MCHC: 33.9 g/dL (ref 30.0–36.0)
MCV: 91.5 fL (ref 80.0–100.0)
Platelets: 344 10*3/uL (ref 150–400)
RBC: 4.58 MIL/uL (ref 3.87–5.11)
RDW: 12.2 % (ref 11.5–15.5)
WBC: 11.1 10*3/uL — ABNORMAL HIGH (ref 4.0–10.5)
nRBC: 0 % (ref 0.0–0.2)

## 2022-05-30 LAB — LIPASE, BLOOD: Lipase: 29 U/L (ref 11–51)

## 2022-05-30 LAB — RESP PANEL BY RT-PCR (FLU A&B, COVID) ARPGX2
Influenza A by PCR: NEGATIVE
Influenza B by PCR: NEGATIVE
SARS Coronavirus 2 by RT PCR: NEGATIVE

## 2022-05-30 LAB — POC URINE PREG, ED: Preg Test, Ur: NEGATIVE

## 2022-05-30 MED ORDER — ONDANSETRON 4 MG PO TBDP: 4.0000 mg | ORAL_TABLET | Freq: Three times a day (TID) | ORAL | 0 refills | Status: AC | PRN

## 2022-05-30 MED ORDER — SODIUM CHLORIDE 0.9 % IV BOLUS
1000.0000 mL | Freq: Once | INTRAVENOUS | Status: AC
  Administered 2022-05-30: 1000 mL via INTRAVENOUS

## 2022-05-30 MED ORDER — ONDANSETRON HCL 4 MG/2ML IJ SOLN
4.0000 mg | Freq: Once | INTRAMUSCULAR | Status: AC
  Administered 2022-05-30: 4 mg via INTRAVENOUS
  Filled 2022-05-30: qty 2

## 2022-05-30 NOTE — ED Triage Notes (Signed)
C/O vomiting and diarrhea x 1 day.

## 2022-05-30 NOTE — ED Provider Notes (Signed)
[facility] Provider Note    Event Date/Time   First MD Initiated Contact with Patient 05/30/22 1115     (approximate)   History   Vomiting   HPI  Jamie Sheppard is a 41 y.o. female who comes in with vomiting diarrhea x1 day.  Patient reports sudden onset of nausea vomiting and diarrhea.  She reports to many episodes to count.  She denies any blood in them.  She denies any recent travel out of the country, antibiotic use, drinking lake or river water.  She denies any abdominal discomfort.  She denies any prior abdominal surgeries.  She denies any  else being sick or any different food eaten last night.  She does report that she has been unable to tolerate eating or drinking anything over the past 24 hours and that because of this that she feels very dizzy.  She reports trying to take meclizine at home but continuing to have vomiting   I reviewed patient's note from 05/02/2022 where patient was seen at the [facility] clinic for foot pain  Physical Exam   Triage Vital Signs: ED Triage Vitals  Enc Vitals Group     BP 05/30/22 1107 102/65     Pulse Rate 05/30/22 1107 91     Resp 05/30/22 1107 16     Temp 05/30/22 1107 98.1 F (36.7 C)     Temp Source 05/30/22 1107 Oral     SpO2 05/30/22 1107 99 %     Weight 05/30/22 1104 169 lb 15.6 oz (77.1 kg)     Height 05/30/22 1104 5\' 2"  (1.575 m)     Head Circumference --      Peak Flow --      Pain Score 05/30/22 1104 0     Pain Loc --      Pain Edu? --      Excl. in GC? --     Most recent vital signs: Vitals:   05/30/22 1107  BP: 102/65  Pulse: 91  Resp: 16  Temp: 98.1 F (36.7 C)  SpO2: 99%     General: Awake, no distress.  CV:  Good peripheral perfusion.  Resp:  Normal effort.  Abd:  No distention.  Soft and nontender Other:  Cranial nerves II to XII are intact.  Equal strength in arms and legs.  Finger-nose intact bilaterally   ED Results / Procedures / Treatments   Labs (all labs  ordered are listed, but only abnormal results are displayed) Labs Reviewed  COMPREHENSIVE METABOLIC PANEL - Abnormal; Notable for the following components:      Result Value   Glucose, Bld 163 (*)    Total Protein 8.3 (*)    All other components within normal limits  CBC - Abnormal; Notable for the following components:   WBC 11.1 (*)    All other components within normal limits  URINALYSIS, ROUTINE W REFLEX MICROSCOPIC - Abnormal; Notable for the following components:   Color, Urine AMBER (*)    APPearance HAZY (*)    Specific Gravity, Urine 1.031 (*)    Glucose, UA 50 (*)    Hgb urine dipstick LARGE (*)    Ketones, ur 5 (*)    Protein, ur 100 (*)    Bacteria, UA MANY (*)    All other components within normal limits  RESP PANEL BY RT-PCR (FLU A&B, COVID) ARPGX2  LIPASE, BLOOD  POC URINE PREG, ED  TROPONIN I (HIGH SENSITIVITY)     EKG  My interpretation of EKG:  Normal sinus rate of 81 without any ST elevation or T wave inversions, normal intervals    PROCEDURES:  Critical Care performed: No  Procedures   MEDICATIONS ORDERED IN ED: Medications  sodium chloride 0.9 % bolus 1,000 mL (1,000 mLs Intravenous New Bag/Given 05/30/22 1204)  ondansetron (ZOFRAN) injection 4 mg (4 mg Intravenous Given 05/30/22 1205)     IMPRESSION / MDM / ASSESSMENT AND PLAN / ED COURSE  I reviewed the triage vital signs and the nursing notes.   Patient's presentation is most consistent with acute complicated illness / injury requiring diagnostic workup.  Suspect this could just be gastroenteritis but will get blood work to evaluate for any evidence of Electra abnormalities, AKI.  Given the dizziness with EKG and troponin this is more likely related to the GI symptoms.  Will test for COVID, flu.  This time her abdomen is soft and nontender endorse patient for appendicitis, diverticulitis, SBO, perforation.  No evidence of stroke upon examination.  Lipase is normal, CMP reassuring slightly  elevated glucose.  CBC shows slightly elevated white count.  Urine shows some evidence of dehydration with some bacteria and RBCs but no WBCs noted will send for urine culture.  Cardiac markers negative  Discussed with patient her UA and she is not menstruating but she reports having history of having blood in her urine and that she was seen by urology and they told her nothing else to be done.  She denies any back pain to suggest kidney stones.  On repeat evaluation her abdomen remains soft and nontender.  After fluids and Zofran she reports resolution of symptoms.  She is tolerating ginger ale.  Repeat abdominal exam is soft and nontender.  She feels comfortable with discharge home at this time  Given the bacteria noted in urine I will send it for culture and they can call her back and see if she develops any symptoms but this time she does not have any known symptoms so holding on antibiotics.   I discussed the provisional nature of ED diagnosis, the treatment so far, the ongoing plan of care, follow up appointments and return precautions with the patient and any family or support people present. They expressed understanding and agreed with the plan, discharged home.    FINAL CLINICAL IMPRESSION(S) / ED DIAGNOSES   Final diagnoses:  Gastroenteritis     Rx / DC Orders   ED Discharge Orders          Ordered    ondansetron (ZOFRAN-ODT) 4 MG disintegrating tablet  Every 8 hours PRN        05/30/22 1343             Note:  This document was prepared using Dragon voice recognition software and may include unintentional dictation errors.   Concha Se, MD 05/30/22 1344

## 2022-05-30 NOTE — ED Notes (Signed)
dc ppw provided. followup and rx information reviewed as applicable. Pt questions addressed at this time. Pt declines VS at time of DC and provides verbal consent for DC. Pt ambulatory to lobby alert and oriented. 

## 2022-05-30 NOTE — Discharge Instructions (Addendum)
I suspect that this is most likely related to a viral illness but if you develop abdominal pain return to the ER for repeat evaluation.  You can take Imodium for diarrhea.  Take Tylenol 1 g every 8 hours for pain and the Zofran to help with nausea  Stay well-hydrated with Gatorade without sugar, Pedialyte

## 2022-05-31 LAB — URINE CULTURE: Culture: <10000 — AB

## 2022-10-15 ENCOUNTER — Other Ambulatory Visit: Payer: Self-pay

## 2022-10-15 DIAGNOSIS — Z1231 Encounter for screening mammogram for malignant neoplasm of breast: Secondary | ICD-10-CM

## 2022-11-10 ENCOUNTER — Other Ambulatory Visit: Payer: Self-pay

## 2022-11-10 ENCOUNTER — Emergency Department: Payer: BC Managed Care – PPO

## 2022-11-10 ENCOUNTER — Emergency Department
Admission: EM | Admit: 2022-11-10 | Discharge: 2022-11-10 | Disposition: A | Discharge status: Home / Self Care | Payer: BC Managed Care – PPO | Attending: Emergency Medicine | Admitting: Emergency Medicine

## 2022-11-10 DIAGNOSIS — S161XXA Strain of muscle, fascia and tendon at neck level, initial encounter: Secondary | ICD-10-CM

## 2022-11-10 DIAGNOSIS — S40012A Contusion of left shoulder, initial encounter: Secondary | ICD-10-CM | POA: Diagnosis not present

## 2022-11-10 DIAGNOSIS — Y92410 Unspecified street and highway as the place of occurrence of the external cause: Secondary | ICD-10-CM | POA: Diagnosis not present

## 2022-11-10 DIAGNOSIS — G935 Compression of brain: Secondary | ICD-10-CM | POA: Diagnosis not present

## 2022-11-10 DIAGNOSIS — S060X0A Concussion without loss of consciousness, initial encounter: Secondary | ICD-10-CM | POA: Diagnosis not present

## 2022-11-10 DIAGNOSIS — S0990XA Unspecified injury of head, initial encounter: Secondary | ICD-10-CM | POA: Diagnosis present

## 2022-11-10 MED ORDER — ONDANSETRON 4 MG PO TBDP
4.0000 mg | ORAL_TABLET | Freq: Once | ORAL | Status: AC
Start: 2022-11-10 — End: 2022-11-10
  Administered 2022-11-10: 4 mg via ORAL
  Filled 2022-11-10: qty 1

## 2022-11-10 MED ORDER — CYCLOBENZAPRINE HCL 7.5 MG PO TABS: 7.5000 mg | ORAL_TABLET | Freq: Every evening | ORAL | 0 refills | Status: AC | PRN

## 2022-11-10 MED ORDER — ONDANSETRON 4 MG PO TBDP: 4.0000 mg | ORAL_TABLET | Freq: Four times a day (QID) | ORAL | 0 refills | Status: AC | PRN
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
## 2022-11-21 ENCOUNTER — Ambulatory Visit
Admission: RE | Admit: 2022-11-21 | Discharge: 2022-11-21 | Disposition: A | Discharge status: Home / Self Care | Payer: BC Managed Care – PPO | Source: Ambulatory Visit

## 2022-11-21 DIAGNOSIS — Z1231 Encounter for screening mammogram for malignant neoplasm of breast: Secondary | ICD-10-CM | POA: Insufficient documentation

## 2024-02-26 ENCOUNTER — Other Ambulatory Visit: Payer: Self-pay | Admitting: Internal Medicine

## 2024-02-26 DIAGNOSIS — Z1231 Encounter for screening mammogram for malignant neoplasm of breast: Secondary | ICD-10-CM
